# Patient Record
Sex: Female | Born: 1940 | Race: White | Hispanic: No | Marital: Single | Smoking: Former smoker
Health system: Southern US, Community
[De-identification: ages and names within clinical notes are randomized; demographics above are authoritative.]

## PROBLEM LIST (undated history)

## (undated) DIAGNOSIS — R251 Tremor, unspecified: Secondary | ICD-10-CM

## (undated) DIAGNOSIS — I480 Paroxysmal atrial fibrillation: Secondary | ICD-10-CM

## (undated) DIAGNOSIS — R413 Other amnesia: Secondary | ICD-10-CM

## (undated) DIAGNOSIS — M3313 Other dermatomyositis without myopathy: Secondary | ICD-10-CM

## (undated) DIAGNOSIS — I10 Essential (primary) hypertension: Secondary | ICD-10-CM

## (undated) DIAGNOSIS — G629 Polyneuropathy, unspecified: Secondary | ICD-10-CM

## (undated) DIAGNOSIS — F068 Other specified mental disorders due to known physiological condition: Secondary | ICD-10-CM

## (undated) DIAGNOSIS — I639 Cerebral infarction, unspecified: Secondary | ICD-10-CM

## (undated) DIAGNOSIS — E119 Type 2 diabetes mellitus without complications: Secondary | ICD-10-CM

## (undated) DIAGNOSIS — I739 Peripheral vascular disease, unspecified: Secondary | ICD-10-CM

## (undated) DIAGNOSIS — I509 Heart failure, unspecified: Secondary | ICD-10-CM

## (undated) DIAGNOSIS — E785 Hyperlipidemia, unspecified: Secondary | ICD-10-CM

## (undated) DIAGNOSIS — M3390 Dermatopolymyositis, unspecified, organ involvement unspecified: Secondary | ICD-10-CM

## (undated) HISTORY — DX: Polyneuropathy, unspecified: G62.9

## (undated) HISTORY — PX: BREAST LUMPECTOMY: SHX2

## (undated) HISTORY — PX: ADENOIDECTOMY: SUR15

## (undated) HISTORY — PX: OTHER SURGICAL HISTORY: SHX169

## (undated) HISTORY — DX: Other amnesia: R41.3

## (undated) HISTORY — PX: INTRAOPERATIVE ARTERIOGRAM: SHX5157

## (undated) HISTORY — DX: Dermatopolymyositis, unspecified, organ involvement unspecified: M33.90

## (undated) HISTORY — DX: Essential (primary) hypertension: I10

## (undated) HISTORY — DX: Tremor, unspecified: R25.1

## (undated) HISTORY — DX: Peripheral vascular disease, unspecified: I73.9

## (undated) HISTORY — DX: Paroxysmal atrial fibrillation: I48.0

## (undated) HISTORY — DX: Heart failure, unspecified: I50.9

## (undated) HISTORY — DX: Cerebral infarction, unspecified: I63.9

## (undated) HISTORY — DX: Type 2 diabetes mellitus without complications: E11.9

## (undated) HISTORY — PX: KNEE CARTILAGE SURGERY: SHX688

## (undated) HISTORY — DX: Other specified mental disorders due to known physiological condition: F06.8

## (undated) HISTORY — DX: Hyperlipidemia, unspecified: E78.5

## (undated) HISTORY — DX: Other dermatomyositis without myopathy: M33.13

---

## 2010-12-11 HISTORY — PX: FEMORAL BYPASS: SHX50

## 2011-01-04 ENCOUNTER — Encounter: Payer: Self-pay | Admitting: Internal Medicine

## 2011-01-09 ENCOUNTER — Ambulatory Visit (INDEPENDENT_AMBULATORY_CARE_PROVIDER_SITE_OTHER): Payer: Medicare Other | Admitting: Internal Medicine

## 2011-01-09 ENCOUNTER — Encounter: Payer: Self-pay | Admitting: Internal Medicine

## 2011-01-09 VITALS — BP 111/63 | HR 70 | Ht 63.0 in | Wt 215.0 lb

## 2011-01-09 DIAGNOSIS — I739 Peripheral vascular disease, unspecified: Secondary | ICD-10-CM

## 2011-01-09 DIAGNOSIS — E785 Hyperlipidemia, unspecified: Secondary | ICD-10-CM

## 2011-01-09 DIAGNOSIS — I4891 Unspecified atrial fibrillation: Secondary | ICD-10-CM

## 2011-01-09 NOTE — Assessment & Plan Note (Signed)
She has clearly documented postoperative afib documented by EKG 12/13/10.  Her afib occurred in the setting of a prolonged vascular procedure.  She is unaware of any other episodes of afib outside of her recent surgery.  Her CHADsVASC score is 4.  She is therefore at increased stroke risk with afib.  I therefore think that it is important for [place] to monitor for further episodes of afib.  If she is documented to have any further afib, she should be switched from ASA/plavix to coumadin.  Given her recent fem/pop procedure and severe PVD, I will continue ASA/ Plavix in the interim. We will place a 21 day event monitor to evaluated for further afib. I will also obtain an echo to evaluate her LA size and evaluate for evidence of structural/ valvular heart disease. Continue toprol XL.

## 2011-01-09 NOTE — Assessment & Plan Note (Signed)
Doing well s/p fem pop bypass Continue ASA, plavix, and statin therapy.

## 2011-01-09 NOTE — Assessment & Plan Note (Signed)
Stable No change required today  

## 2011-01-09 NOTE — Progress Notes (Signed)
Sherry Vargas is a pleasant 70 y.o.WF patient with a h/o DM and PVD who presents for cardiology consultation regarding recently diagnosed afib.  She reports recently being admitted to [facility] for femoral popliteal bypass 6/12.  She has severe PVD and is s/p multiple failed endovascular stents to the R femoral artery in the past.  While in the hospital, she was observed to have afib.  She reports that this initially occurred 24 hours posteratively.  She reports symptoms of chest tightness with tachypalpitations.  While in the hospital, she had 2 further episodes of afib for which she was asymptomatic.  She was evaluated by Cardiology and placed on metoprolol and ASA 325mg  daily.  She reports doing well since that time.  She is unaware of any further symptoms of afib.  Presently, she is doing well without complaint.  Today, she denies symptoms of palpitations, chest pain, shortness of breath, orthopnea, PND, lower extremity edema, dizziness, presyncope, syncope, or neurologic sequela. The patient is tolerating medications without difficulties and is otherwise without complaint today.   Past Medical History  Diagnosis Date  . HLD (hyperlipidemia)   . DM (dermatomyositis)   . PAD (peripheral artery disease)   . 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 Paroxysmal a-fib    Past Surgical History  Procedure Date  . Adenoidectomy   . Breast lumpectomy     benign  . Knee cartilage surgery   . Intraoperative arteriogram     for claudication in L leg 2004 at [facility]  . Endovascular stents     R leg at [facility]  . Femoral bypass 12/11/10    R leg at [facility]    Current Outpatient Prescriptions  Medication Sig Dispense Refill  . aspirin 325 MG tablet Take 325 mg by mouth daily.        [name] CINNAMON PO Take by mouth. With chromine, 2 tablets daily        . clopidogrel (PLAVIX) 75 MG tablet Take 75 mg by mouth daily.        . fish oil-omega-3 fatty acids 1000 MG capsule Take 1 g by mouth daily.        [name]  glipiZIDE (GLUCOTROL) 5 MG tablet Take 1 tablet by mouth Daily.      . LUTEIN-ZEAXANTHIN PO Take by mouth daily.        . metFORMIN (GLUCOPHAGE) 500 MG tablet Take 1,000 mg by mouth 2 (two) times daily with a meal.        . metoprolol (TOPROL-XL) 100 MG 24 hr tablet Take 1 tablet by mouth Daily.      . pioglitazone (ACTOS) 15 MG tablet Take 15 mg by mouth 2 (two) times daily.        . Rosuvastatin Calcium (CRESTOR PO) Take 15 mg by mouth.          No Known Allergies  History   Social History  . Marital Status: Single    Spouse Name: N/A    Number of Children: N/A  . Years of Education: N/A   Occupational History  . Not on file.   Social History Main Topics  . Smoking status: Former Games developer  . Smokeless tobacco: Not on file   Comment: quit 2005  . Alcohol Use: Yes     occasionally  . Drug Use: No  . Sexually Active: Not on file   Other Topics Concern  . Not on file   Social History Narrative   Lives in [place],  near [employer].Retired Product/process development scientist    Family History  Problem Relation Age of Onset  . Lung cancer Mother   . Diabetes Father     a fib also    ROS- All systems are reviewed and negative except as per the HPI above  Physical Exam: Filed Vitals:   01/09/11 1602  BP: 111/63  Pulse: 70  Height: 5\' 3"  (1.6 m)  Weight: 215 lb (97.523 kg)    GEN- The patient is well appearing, alert and oriented x 3 today.   Head- normocephalic, atraumatic Eyes-  Sclera clear, conjunctiva pink Ears- hearing intact Oropharynx- clear Neck- supple, no JVP Lymph- no cervical lymphadenopathy Lungs- Clear to ausculation bilaterally, normal work of breathing Heart- Regular rate and rhythm, early systolic murmur LUSB, no rubs or gallops, PMI not laterally displaced GI- soft, NT, ND, + BS Extremities- no clubbing, cyanosis, 1+ RLE edema MS- no significant deformity or atrophy Skin- fem pop incisions are healing Psych- euthymic mood, full affect Neuro-  strength and sensation are intact  EKG today reveals sinus rhythm at 70 bpm, otherwise normal ekg EKG from 12/13/10 at [facility] reveals afib with RVR  Assessment and Plan:

## 2011-01-09 NOTE — Patient Instructions (Signed)
Your physician recommends that you schedule a follow-up appointment in:2 months with Dr Johney Frame  Your physician has requested that you have an echocardiogram. Echocardiography is a painless test that uses sound waves to create images of your heart. It provides your doctor with information about the size and shape of your heart and how well your heart's chambers and valves are working. This procedure takes approximately one hour. There are no restrictions for this procedure.  Your physician has recommended that you wear an event monitor. Event monitors are medical devices that record the heart's electrical activity. Doctors most often [place] these monitors to diagnose arrhythmias. Arrhythmias are problems with the speed or rhythm of the heartbeat. The monitor is a small, portable device. You can wear one while you do your normal daily activities. This is usually used to diagnose what is causing palpitations/syncope (passing out).

## 2011-01-18 ENCOUNTER — Encounter (INDEPENDENT_AMBULATORY_CARE_PROVIDER_SITE_OTHER): Payer: Medicare Other

## 2011-01-18 ENCOUNTER — Ambulatory Visit (HOSPITAL_COMMUNITY): Payer: Medicare Other | Attending: Internal Medicine | Admitting: Radiology

## 2011-01-18 DIAGNOSIS — I4891 Unspecified atrial fibrillation: Secondary | ICD-10-CM

## 2011-01-18 DIAGNOSIS — R002 Palpitations: Secondary | ICD-10-CM | POA: Insufficient documentation

## 2011-01-18 DIAGNOSIS — E119 Type 2 diabetes mellitus without complications: Secondary | ICD-10-CM | POA: Insufficient documentation

## 2011-01-18 DIAGNOSIS — R000 Tachycardia, unspecified: Secondary | ICD-10-CM | POA: Insufficient documentation

## 2011-01-18 DIAGNOSIS — E785 Hyperlipidemia, unspecified: Secondary | ICD-10-CM | POA: Insufficient documentation

## 2011-01-18 DIAGNOSIS — I059 Rheumatic mitral valve disease, unspecified: Secondary | ICD-10-CM | POA: Insufficient documentation

## 2011-01-18 DIAGNOSIS — I079 Rheumatic tricuspid valve disease, unspecified: Secondary | ICD-10-CM | POA: Insufficient documentation

## 2011-01-28 ENCOUNTER — Encounter: Payer: Self-pay | Admitting: Internal Medicine

## 2011-02-19 ENCOUNTER — Encounter: Payer: Self-pay | Admitting: *Deleted

## 2011-03-13 ENCOUNTER — Ambulatory Visit: Payer: Medicare Other | Admitting: Internal Medicine

## 2011-03-18 ENCOUNTER — Ambulatory Visit (INDEPENDENT_AMBULATORY_CARE_PROVIDER_SITE_OTHER): Payer: Medicare Other | Admitting: Internal Medicine

## 2011-03-18 ENCOUNTER — Encounter: Payer: Self-pay | Admitting: Internal Medicine

## 2011-03-18 DIAGNOSIS — E785 Hyperlipidemia, unspecified: Secondary | ICD-10-CM

## 2011-03-18 DIAGNOSIS — I4891 Unspecified atrial fibrillation: Secondary | ICD-10-CM

## 2011-03-18 DIAGNOSIS — I10 Essential (primary) hypertension: Secondary | ICD-10-CM

## 2011-03-18 DIAGNOSIS — I739 Peripheral vascular disease, unspecified: Secondary | ICD-10-CM

## 2011-03-18 NOTE — Progress Notes (Signed)
The patient presents today for routine cardiology followup.  Since last being seen in our clinic, the patient reports doing very well. She is unaware of any further afib. Today, she denies symptoms of palpitations, chest pain, shortness of breath, orthopnea, PND, lower extremity edema, dizziness, presyncope, syncope, or neurologic sequela.  The patient feels that she is tolerating medications without difficulties and is otherwise without complaint today.   Past Medical History  Diagnosis Date  . HLD (hyperlipidemia)   . DM (dermatomyositis)   . PAD (peripheral artery disease)   . 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 Paroxysmal a-fib    Past Surgical History  Procedure Date  . Adenoidectomy   . Breast lumpectomy     benign  . Knee cartilage surgery   . Intraoperative arteriogram     for claudication in L leg 2004 at [facility]  . Endovascular stents     R leg at [facility]  . Femoral bypass 12/11/10    R leg at [facility]    Current Outpatient Prescriptions  Medication Sig Dispense Refill  . aspirin 325 MG tablet Take 325 mg by mouth daily.        [name] CINNAMON PO Take by mouth. With chromine, 2 tablets daily        . clopidogrel (PLAVIX) 75 MG tablet Take 75 mg by mouth daily.        . fish oil-omega-3 fatty acids 1000 MG capsule Take 1 g by mouth daily.        [name] glipiZIDE (GLUCOTROL) 5 MG tablet Take 1 tablet by mouth Daily.      . LUTEIN-ZEAXANTHIN PO Take by mouth daily.        . metFORMIN (GLUCOPHAGE) 500 MG tablet Take 1,000 mg by mouth 2 (two) times daily with a meal.        . metoprolol (TOPROL-XL) 100 MG 24 hr tablet Take 1 tablet by mouth Daily.      . pioglitazone (ACTOS) 15 MG tablet Take 15 mg by mouth 2 (two) times daily.        . Rosuvastatin Calcium (CRESTOR PO) Take 15 mg by mouth.          No Known Allergies  History   Social History  . Marital Status: Single    Spouse Name: N/A    Number of Children: N/A  . Years of Education: N/A   Occupational History    . Not on file.   Social History Main Topics  . Smoking status: Former Games developer  . Smokeless tobacco: Not on file   Comment: quit 2005  . Alcohol Use: Yes     occasionally  . Drug Use: No  . Sexually Active: Not on file   Other Topics Concern  . Not on file   Social History Narrative   Lives in [place], near [place].Retired Product/process development scientist    Family History  Problem Relation Age of Onset  . Lung cancer Mother   . Diabetes Father     a fib also    ROS-  All systems are reviewed and are negative except as outlined in the HPI above   Physical Exam: Filed Vitals:   03/18/11 1043  BP: 118/64  Pulse: 70  Height: 5' 3.5" (1.613 m)  Weight: 215 lb 12.8 oz (97.886 kg)    GEN- The patient is well appearing, alert and oriented x 3 today.   Head- normocephalic, atraumatic Eyes-  Sclera clear, conjunctiva pink Ears-  hearing intact Oropharynx- clear Neck- supple, no JVP Lymph- no cervical lymphadenopathy Lungs- Clear to ausculation bilaterally, normal work of breathing Heart- Regular rate and rhythm, no murmurs, rubs or gallops, PMI not laterally displaced GI- soft, NT, ND, + BS Extremities- no clubbing, cyanosis, or edema MS- no significant deformity or atrophy Skin- no rash or lesion Psych- euthymic mood, full affect Neuro- strength and sensation are intact  ekg today reveals sinus rhythm 70 bpm,otherwise normal ekg Event monitor from 7/12-8/12 reveals no afib/ no arrhythmias Echo reviewed with patient  Assessment and Plan:

## 2011-03-18 NOTE — Patient Instructions (Signed)
Your physician wants you to follow-up in: 4 months You will receive a reminder letter in the mail two months in advance. If you don't receive a letter, please call our office to schedule the follow-up appointment.     .Your physician recommends that you continue on your current medications as directed. Please refer to the Current Medication list given to you today.  

## 2011-03-18 NOTE — Assessment & Plan Note (Signed)
Stable No change required today  

## 2011-03-18 NOTE — Assessment & Plan Note (Signed)
She had prior intraop afib but has done very well since. Recent event monitor reveals no afib We will monitor clinically and continue ASA/Plavix for PVD.  If she has any further afib, she will require coumadin or a novel anticoagulant.  Continue toprol for rate control.

## 2011-05-13 ENCOUNTER — Encounter (HOSPITAL_BASED_OUTPATIENT_CLINIC_OR_DEPARTMENT_OTHER): Payer: Medicare Other | Attending: General Surgery

## 2011-05-13 DIAGNOSIS — Y832 Surgical operation with anastomosis, bypass or graft as the cause of abnormal reaction of the patient, or of later complication, without mention of misadventure at the time of the procedure: Secondary | ICD-10-CM | POA: Insufficient documentation

## 2011-05-13 DIAGNOSIS — Z7982 Long term (current) use of aspirin: Secondary | ICD-10-CM | POA: Insufficient documentation

## 2011-05-13 DIAGNOSIS — I10 Essential (primary) hypertension: Secondary | ICD-10-CM | POA: Insufficient documentation

## 2011-05-13 DIAGNOSIS — I872 Venous insufficiency (chronic) (peripheral): Secondary | ICD-10-CM | POA: Insufficient documentation

## 2011-05-13 DIAGNOSIS — E669 Obesity, unspecified: Secondary | ICD-10-CM | POA: Insufficient documentation

## 2011-05-13 DIAGNOSIS — T8189XA Other complications of procedures, not elsewhere classified, initial encounter: Secondary | ICD-10-CM | POA: Insufficient documentation

## 2011-05-13 DIAGNOSIS — L97809 Non-pressure chronic ulcer of other part of unspecified lower leg with unspecified severity: Secondary | ICD-10-CM | POA: Insufficient documentation

## 2011-05-13 DIAGNOSIS — E119 Type 2 diabetes mellitus without complications: Secondary | ICD-10-CM | POA: Insufficient documentation

## 2011-05-13 DIAGNOSIS — Z7902 Long term (current) use of antithrombotics/antiplatelets: Secondary | ICD-10-CM | POA: Insufficient documentation

## 2011-05-13 DIAGNOSIS — Z79899 Other long term (current) drug therapy: Secondary | ICD-10-CM | POA: Insufficient documentation

## 2011-05-13 NOTE — H&P (Signed)
NAME:  Sherry Vargas, BORBA NO.:  [id number]  MEDICAL RECORD NO.:  [id number]  LOCATION:  [facility]                         FACILITY:  [facility]  PHYSICIAN:  Ardath Sax, M.D.     DATE OF BIRTH:  05/11/1941  DATE OF ADMISSION:  01/18/2011 DATE OF DISCHARGE:  01/18/2011                             HISTORY & PHYSICAL   This is a 70 year old lady who comes to the [facility] for the first time with a history of having undergone a right femoral-popliteal bypass graft, and now the lower wound on the medial side of her right thigh is open, it is about 3 inches long and 1/2 inch deep.  It is very clean and has been taking care of by the patient and her family doctor.  She is diabetic, hypertensive, and obese.  She is on medication for her hypertension and her diabetes.  She is on Plavix, Crestor, aspirin, Toprol-XL, glipizide, and metformin.  She is also on oxycodone for pain. [name] while she was here today we washed out the wound and we put collagen on it and we are making application for her insurance coming to okay an Apligraf on her next visit, so hopefully next week we will put on an Apligraf.     Ardath Sax, M.D.     PP/MEDQ  D:  05/13/2011  T:  05/13/2011  Job:  [id number]

## 2011-06-03 ENCOUNTER — Encounter (HOSPITAL_BASED_OUTPATIENT_CLINIC_OR_DEPARTMENT_OTHER): Payer: Medicare Other | Attending: Internal Medicine

## 2011-06-03 DIAGNOSIS — M79609 Pain in unspecified limb: Secondary | ICD-10-CM | POA: Insufficient documentation

## 2011-06-03 DIAGNOSIS — Y838 Other surgical procedures as the cause of abnormal reaction of the patient, or of later complication, without mention of misadventure at the time of the procedure: Secondary | ICD-10-CM | POA: Insufficient documentation

## 2011-06-03 DIAGNOSIS — E119 Type 2 diabetes mellitus without complications: Secondary | ICD-10-CM | POA: Insufficient documentation

## 2011-06-03 DIAGNOSIS — T8189XA Other complications of procedures, not elsewhere classified, initial encounter: Secondary | ICD-10-CM | POA: Insufficient documentation

## 2011-06-03 DIAGNOSIS — I739 Peripheral vascular disease, unspecified: Secondary | ICD-10-CM | POA: Insufficient documentation

## 2011-06-10 ENCOUNTER — Encounter (HOSPITAL_BASED_OUTPATIENT_CLINIC_OR_DEPARTMENT_OTHER): Payer: Medicare Other

## 2011-07-08 ENCOUNTER — Encounter (HOSPITAL_BASED_OUTPATIENT_CLINIC_OR_DEPARTMENT_OTHER): Payer: Medicare Other | Attending: Internal Medicine

## 2011-07-08 DIAGNOSIS — T8189XA Other complications of procedures, not elsewhere classified, initial encounter: Secondary | ICD-10-CM | POA: Insufficient documentation

## 2011-07-08 DIAGNOSIS — I739 Peripheral vascular disease, unspecified: Secondary | ICD-10-CM | POA: Insufficient documentation

## 2011-07-08 DIAGNOSIS — Y838 Other surgical procedures as the cause of abnormal reaction of the patient, or of later complication, without mention of misadventure at the time of the procedure: Secondary | ICD-10-CM | POA: Insufficient documentation

## 2011-07-08 DIAGNOSIS — M79609 Pain in unspecified limb: Secondary | ICD-10-CM | POA: Insufficient documentation

## 2011-07-08 DIAGNOSIS — E119 Type 2 diabetes mellitus without complications: Secondary | ICD-10-CM | POA: Insufficient documentation

## 2011-07-15 ENCOUNTER — Encounter: Payer: Self-pay | Admitting: Internal Medicine

## 2011-07-15 ENCOUNTER — Ambulatory Visit (INDEPENDENT_AMBULATORY_CARE_PROVIDER_SITE_OTHER): Payer: Medicare Other | Admitting: Internal Medicine

## 2011-07-15 VITALS — BP 120/66 | HR 72 | Ht 63.0 in | Wt 222.0 lb

## 2011-07-15 DIAGNOSIS — I4891 Unspecified atrial fibrillation: Secondary | ICD-10-CM

## 2011-07-15 NOTE — Assessment & Plan Note (Signed)
Post op afib following femoral bypass 6/12  Continue ASA 81mg  daily and plavix 75mg  for PVD.  Consider coumadin if afib recurs.

## 2011-07-15 NOTE — Progress Notes (Signed)
The patient presents today for routine cardiology followup.  Since last being seen in our clinic, she has been unaware of any further afib. Her primary concern is with poor wound healing from her femoral bypass.  Her venous stasis is stable.  Today, she denies symptoms of palpitations, chest pain, or  shortness of breath.  The patient feels that she is tolerating medications without difficulties and is otherwise without complaint today.   Past Medical History  Diagnosis Date  . HLD (hyperlipidemia)   . DM (dermatomyositis)   . PAD (peripheral artery disease)   . 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 Paroxysmal a-fib     post operative following fem bypass at [facility] 12/11/10   Past Surgical History  Procedure Date  . Adenoidectomy   . Breast lumpectomy     benign  . Knee cartilage surgery   . Intraoperative arteriogram     for claudication in L leg 2004 at [facility]  . Endovascular stents     R leg at [facility]  . Femoral bypass 12/11/10    R leg at [facility]    Current Outpatient Prescriptions  Medication Sig Dispense Refill  . aspirin 325 MG tablet Take 325 mg by mouth daily.        [name] CINNAMON PO Take by mouth. With chromine, 2 tablets daily        . clopidogrel (PLAVIX) 75 MG tablet Take 75 mg by mouth daily.        . fish oil-omega-3 fatty acids 1000 MG capsule Take 1 g by mouth daily.        . furosemide (LASIX) 40 MG tablet Take 1 tablet by mouth Every other day.      [name] glipiZIDE (GLUCOTROL) 5 MG tablet Take 1 tablet by mouth Daily.      [name] lisinopril (PRINIVIL,ZESTRIL) 5 MG tablet Take 5 mg by mouth daily.      . LUTEIN-ZEAXANTHIN PO Take by mouth daily.        . metFORMIN (GLUCOPHAGE) 500 MG tablet Take 500 mg by mouth 2 (two) times daily with a meal.       . metoprolol (TOPROL-XL) 100 MG 24 hr tablet Take 1 tablet by mouth Daily.      . pioglitazone-metformin (ACTOPLUS MET) 15-500 MG per tablet Take 1 tablet by mouth 2 (two) times daily with a meal.      . Rosuvastatin  Calcium (CRESTOR PO) Take 15 mg by mouth.          No Known Allergies  History   Social History  . Marital Status: Single    Spouse Name: N/A    Number of Children: N/A  . Years of Education: N/A   Occupational History  . Not on file.   Social History Main Topics  . Smoking status: Former Games developer  . Smokeless tobacco: Not on file   Comment: quit 2005  . Alcohol Use: Yes     occasionally  . Drug Use: No  . Sexually Active: Not on file   Other Topics Concern  . Not on file   Social History Narrative   Lives in [place], near [place].Retired Product/process development scientist    Family History  Problem Relation Age of Onset  . Lung cancer Mother   . Diabetes Father     a fib also   Physical Exam: Filed Vitals:   07/15/11 1527  BP: 120/66  Pulse: 72  Height: 5\' 3"  (1.6 m)  Weight: 222 lb (100.699 kg)    GEN- The patient is well appearing, alert and oriented x 3 today.   Head- normocephalic, atraumatic Eyes-  Sclera clear, conjunctiva pink Ears- hearing intact Oropharynx- clear Neck- supple, no JVP Lymph- no cervical lymphadenopathy Lungs- Clear to ausculation bilaterally, normal work of breathing Heart- Regular rate and rhythm, no murmurs, rubs or gallops, PMI not laterally displaced GI- soft, NT, ND, + BS Extremities- no clubbing, cyanosis, 2+ ble edema with venous stasis changes  ekg today reveals sinus rhythm 72 bpm,otherwise normal ekg Event monitor from 7/12-8/12 reveals no afib/ no arrhythmias   Assessment and Plan:

## 2011-07-15 NOTE — Patient Instructions (Signed)
Your physician wants you to follow-up in: 9 months with Dr Lona Millard will receive a reminder letter in the mail two months in advance. If you don't receive a letter, please call our office to schedule the follow-up appointment.   Decrease ASA to 81mg  daily

## 2012-05-05 ENCOUNTER — Encounter: Payer: Self-pay | Admitting: Internal Medicine

## 2012-05-22 ENCOUNTER — Ambulatory Visit (INDEPENDENT_AMBULATORY_CARE_PROVIDER_SITE_OTHER): Payer: Medicare Other | Admitting: Internal Medicine

## 2012-05-22 ENCOUNTER — Encounter: Payer: Self-pay | Admitting: Internal Medicine

## 2012-05-22 VITALS — BP 135/43 | HR 75 | Ht 63.0 in | Wt 227.0 lb

## 2012-05-22 DIAGNOSIS — E785 Hyperlipidemia, unspecified: Secondary | ICD-10-CM

## 2012-05-22 DIAGNOSIS — I4891 Unspecified atrial fibrillation: Secondary | ICD-10-CM

## 2012-05-22 DIAGNOSIS — I739 Peripheral vascular disease, unspecified: Secondary | ICD-10-CM

## 2012-05-22 NOTE — Patient Instructions (Addendum)
Your physician wants you to follow-up in: 12 months with Dr Allred You will receive a reminder letter in the mail two months in advance. If you don't receive a letter, please call our office to schedule the follow-up appointment.  

## 2012-05-24 NOTE — Assessment & Plan Note (Signed)
Stable No change required today  

## 2012-05-24 NOTE — Assessment & Plan Note (Signed)
Only 1 episode of perioperative afib, none since If she develops afib in the future then she will require anticoagulation at that point  No changes at this time

## 2012-05-24 NOTE — Assessment & Plan Note (Signed)
Followed at [facility] Continue ASA and plavix

## 2012-05-24 NOTE — Progress Notes (Signed)
PCP: Leo Grosser, MD  Sherry Vargas is a 71 y.o. female who presents today for routine electrophysiology followup.  Since last being seen in our clinic, the patient reports doing very well. She has had no further afib.  Today, she denies symptoms of palpitations, chest pain, shortness of breath,  lower extremity edema, dizziness, presyncope, or syncope.  The patient is otherwise without complaint today.   Past Medical History  Diagnosis Date  . HLD (hyperlipidemia)   . DM (dermatomyositis)   . PAD (peripheral artery disease)   . 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 Paroxysmal a-fib     post operative following fem bypass at [facility] 12/11/10   Past Surgical History  Procedure Date  . Adenoidectomy   . Breast lumpectomy     benign  . Knee cartilage surgery   . Intraoperative arteriogram     for claudication in L leg 2004 at [facility]  . Endovascular stents     R leg at [facility]  . Femoral bypass 12/11/10    R leg at [facility]    Current Outpatient Prescriptions  Medication Sig Dispense Refill  . aspirin 81 MG tablet Take one daily      . clopidogrel (PLAVIX) 75 MG tablet Take 75 mg by mouth daily.        [name] glipiZIDE (GLUCOTROL) 5 MG tablet Take 1 tablet by mouth Daily.      . IRON PO Take by mouth daily.      [name] lisinopril (PRINIVIL,ZESTRIL) 5 MG tablet Take 5 mg by mouth daily.      . LUTEIN-ZEAXANTHIN PO Take by mouth daily.        . metFORMIN (GLUCOPHAGE) 500 MG tablet Take 500 mg by mouth 2 (two) times daily with a meal.       . metoprolol (TOPROL-XL) 100 MG 24 hr tablet Take 1 tablet by mouth Daily.      [name] PANTOPRAZOLE SODIUM PO Take by mouth daily.      . pioglitazone-metformin (ACTOPLUS MET) 15-500 MG per tablet Take 1 tablet by mouth 2 (two) times daily with a meal.      . Rosuvastatin Calcium (CRESTOR PO) Take 15 mg by mouth.          Physical Exam: Filed Vitals:   05/22/12 1130  BP: 135/43  Pulse: 75  Height: 5\' 3"  (1.6 m)  Weight: 227 lb (102.967 kg)     GEN- The patient is well appearing, alert and oriented x 3 today.   Head- normocephalic, atraumatic Eyes-  Sclera clear, conjunctiva pink Ears- hearing intact Oropharynx- clear Lungs- Clear to ausculation bilaterally, normal work of breathing Heart- Regular rate and rhythm, no murmurs, rubs or gallops, PMI not laterally displaced GI- soft, NT, ND, + BS Extremities- no clubbing, cyanosis, or edema  ekg today reveals sinus rhythm 67 bpm, otherwise normal ekg  Assessment and Plan:

## 2013-03-19 ENCOUNTER — Ambulatory Visit (INDEPENDENT_AMBULATORY_CARE_PROVIDER_SITE_OTHER): Payer: Medicare Other | Admitting: Family Medicine

## 2013-03-19 ENCOUNTER — Encounter: Payer: Self-pay | Admitting: Family Medicine

## 2013-03-19 VITALS — BP 108/64 | HR 78 | Temp 98.3°F | Resp 18 | Ht 62.0 in | Wt 233.0 lb

## 2013-03-19 DIAGNOSIS — E119 Type 2 diabetes mellitus without complications: Secondary | ICD-10-CM | POA: Insufficient documentation

## 2013-03-19 DIAGNOSIS — L538 Other specified erythematous conditions: Secondary | ICD-10-CM

## 2013-03-19 DIAGNOSIS — R0609 Other forms of dyspnea: Secondary | ICD-10-CM

## 2013-03-19 DIAGNOSIS — I4891 Unspecified atrial fibrillation: Secondary | ICD-10-CM

## 2013-03-19 DIAGNOSIS — Z23 Encounter for immunization: Secondary | ICD-10-CM

## 2013-03-19 DIAGNOSIS — L304 Erythema intertrigo: Secondary | ICD-10-CM

## 2013-03-19 LAB — COMPLETE METABOLIC PANEL WITH GFR
Albumin: 3.7 g/dL (ref 3.5–5.2)
CO2: 27 mEq/L (ref 19–32)
Calcium: 9.1 mg/dL (ref 8.4–10.5)
Chloride: 97 mEq/L (ref 96–112)
GFR, Est African American: 89 mL/min
GFR, Est Non African American: 77 mL/min
Glucose, Bld: 130 mg/dL — ABNORMAL HIGH (ref 70–99)
Potassium: 3.8 mEq/L (ref 3.5–5.3)
Sodium: 129 mEq/L — ABNORMAL LOW (ref 135–145)
Total Protein: 6.1 g/dL (ref 6.0–8.3)

## 2013-03-19 LAB — CBC WITH DIFFERENTIAL/PLATELET
HCT: 32.8 % — ABNORMAL LOW (ref 36.0–46.0)
Hemoglobin: 10.7 g/dL — ABNORMAL LOW (ref 12.0–15.0)
Lymphs Abs: 1.6 10*3/uL (ref 0.7–4.0)
Monocytes Relative: 6 % (ref 3–12)
Neutro Abs: 4.1 10*3/uL (ref 1.7–7.7)
Neutrophils Relative %: 64 % (ref 43–77)
RBC: 3.84 MIL/uL — ABNORMAL LOW (ref 3.87–5.11)

## 2013-03-19 LAB — LIPID PANEL
LDL Cholesterol: 51 mg/dL (ref 0–99)
Triglycerides: 93 mg/dL (ref <150)
VLDL: 19 mg/dL (ref 0–40)

## 2013-03-19 MED ORDER — CLOTRIMAZOLE-BETAMETHASONE 1-0.05 % EX CREA: TOPICAL_CREAM | Freq: Two times a day (BID) | CUTANEOUS | Status: DC

## 2013-03-19 MED ORDER — METOPROLOL SUCCINATE ER 100 MG PO TB24: 200.0000 mg | ORAL_TABLET | Freq: Every day | ORAL | Status: DC

## 2013-03-19 NOTE — Progress Notes (Signed)
Subjective:    Patient ID: Sherry Vargas, female    DOB: 29-Jan-1941, 72 y.o.   MRN: [id number]  HPI Patient presents today complaining of dyspnea on exertion. She has a history of paroxysmal nature fibrillation. Today in the exam she is definitely in atrial fibrillation. Her heart rate is 100-120 beats per minute and erratic. She denies any chest pain.  However she gets winded with minimal activity he ate she also has a painful itching rash in the intertriginous areas of both upper thighs and her perineum. This has the characteristic appearance of candida intertrigo. Her past medical history is complicated by diabetes mellitus type 2, hyperlipidemia, peripheral vascular disease. She had an echocardiogram of the heart performed in 2012 and ejection fraction of 60-65%. She did have some mild pulmonary hypertension.  Of note she has gained approximately 6 pounds since her last office visit. She has some mild pitting edema in both extremities. She ran out of her furosemide one month ago and has not been taking it. This may also be contributing to her peripheral edema. Past Medical History  Diagnosis Date  . HLD (hyperlipidemia)   . DM (dermatomyositis)   . PAD (peripheral artery disease)   . 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 Paroxysmal a-fib     post operative following fem bypass at [facility] 12/11/10  . Hypertension   . Diabetes mellitus without complication    Past Surgical History  Procedure Laterality Date  . Adenoidectomy    . Breast lumpectomy      benign  . Knee cartilage surgery    . Intraoperative arteriogram      for claudication in L leg 2004 at [facility]  . Endovascular stents      R leg at [facility]  . Femoral bypass  12/11/10    R leg at [facility]   Current Outpatient Prescriptions on File Prior to Visit  Medication Sig Dispense Refill  . aspirin 81 MG tablet Take one daily      . clopidogrel (PLAVIX) 75 MG tablet Take 75 mg by mouth daily.        [name] glipiZIDE (GLUCOTROL)  5 MG tablet Take 1 tablet by mouth Daily.      [name] lisinopril (PRINIVIL,ZESTRIL) 5 MG tablet Take 5 mg by mouth daily.      . LUTEIN-ZEAXANTHIN PO Take by mouth daily.        . metFORMIN (GLUCOPHAGE) 500 MG tablet Take 500 mg by mouth 2 (two) times daily with a meal.       . metoprolol (TOPROL-XL) 100 MG 24 hr tablet Take 1 tablet by mouth Daily.      . pioglitazone-metformin (ACTOPLUS MET) 15-500 MG per tablet Take 1 tablet by mouth 2 (two) times daily with a meal.       No current facility-administered medications on file prior to visit.   No Known Allergies History   Social History  . Marital Status: Single    Spouse Name: N/A    Number of Children: N/A  . Years of Education: N/A   Occupational History  . Not on file.   Social History Main Topics  . Smoking status: Former Games developer  . Smokeless tobacco: Not on file     Comment: quit 2005  . Alcohol Use: Yes     Comment: occasionally  . Drug Use: No  . Sexual Activity: Not on file   Other Topics Concern  . Not on file   Social History Narrative  Lives in [place], near [place].   Retired Product/process development scientist   Family History  Problem Relation Age of Onset  . Lung cancer Mother   . Diabetes Father     a fib also      Review of Systems  All other systems reviewed and are negative.       Objective:   Physical Exam  Vitals reviewed. Constitutional: She appears well-developed and well-nourished.  Neck: Neck supple. No JVD present. No thyromegaly present.  Cardiovascular: Normal heart sounds and intact distal pulses.  An irregularly irregular rhythm present. Tachycardia present.  PMI is not displaced.   Pulmonary/Chest: Effort normal and breath sounds normal. No respiratory distress. She has no wheezes. She has no rales. She exhibits no tenderness.  Abdominal: Soft. Bowel sounds are normal. She exhibits no distension and no mass. There is no tenderness. There is no rebound and no guarding.  Musculoskeletal:  She exhibits edema.  Lymphadenopathy:    She has no cervical adenopathy.  Skin: Skin is warm. Rash noted. There is erythema.   intertrigo in the upper thighs bilaterally. EKG reveals atrial fibrillation with tachycardia at 121 beats per minute. There is no evidence of ischemia or infarction. There no ST changes.        Assessment & Plan:  1. Need for prophylactic vaccination and inoculation against influenza  - Flu Vaccine QUAD 36+ mos IM  2. Dyspnea on exertion I believe this is likely related to tachycardia due to age fibrillation with poor rate control. I am going to arrange a follow up with Dr. Johney Frame. Meanwhile I'm going to increase her Toprol-XL to 200 mg a day. Asked patient to resume her Lasix. I will also check some fasting lab work to rule out anemia.  See the patient back next week for a recheck. - EKG 12-Lead - CBC with Differential - COMPLETE METABOLIC PANEL WITH GFR - Hemoglobin A1c - Lipid panel - Pro b natriuretic peptide (BNP)  3. Atrial fibrillation Increase Toprol to 200 mg by mouth daily. Currently taking aspirin and Plavix. She is not on any anti-thrombotic agent for stroke prevention 4. Intertrigo Lotrisone twice a day for 10-14 days. - clotrimazole-betamethasone (LOTRISONE) cream; Apply topically 2 (two) times daily.  Dispense: 30 g; Refill: 0

## 2013-03-22 ENCOUNTER — Ambulatory Visit (INDEPENDENT_AMBULATORY_CARE_PROVIDER_SITE_OTHER): Payer: Medicare Other | Admitting: Family Medicine

## 2013-03-22 ENCOUNTER — Encounter: Payer: Self-pay | Admitting: Family Medicine

## 2013-03-22 VITALS — BP 138/84 | HR 114 | Resp 20 | Wt 235.0 lb

## 2013-03-22 DIAGNOSIS — R0989 Other specified symptoms and signs involving the circulatory and respiratory systems: Secondary | ICD-10-CM

## 2013-03-22 DIAGNOSIS — I4891 Unspecified atrial fibrillation: Secondary | ICD-10-CM

## 2013-03-22 DIAGNOSIS — R0609 Other forms of dyspnea: Secondary | ICD-10-CM

## 2013-03-22 MED ORDER — FUROSEMIDE 40 MG PO TABS: 40.0000 mg | ORAL_TABLET | Freq: Every day | ORAL | Status: DC | PRN

## 2013-03-22 MED ORDER — DILTIAZEM HCL ER COATED BEADS 120 MG PO CP24: 120.0000 mg | ORAL_CAPSULE | Freq: Every day | ORAL | Status: DC

## 2013-03-22 NOTE — Progress Notes (Signed)
Subjective:    Patient ID: Sherry Vargas, female    DOB: 1941-01-24, 72 y.o.   MRN: [id number]  HPI 03/19/13 Patient presents today complaining of dyspnea on exertion. She has a history of paroxysmal nature fibrillation. Today in the exam she is definitely in atrial fibrillation. Her heart rate is 100-120 beats per minute and erratic. She denies any chest pain.  However she gets winded with minimal activity he ate she also has a painful itching rash in the intertriginous areas of both upper thighs and her perineum. This has the characteristic appearance of candida intertrigo. Her past medical history is complicated by diabetes mellitus type 2, hyperlipidemia, peripheral vascular disease. She had an echocardiogram of the heart performed in 2012 and ejection fraction of 60-65%. She did have some mild pulmonary hypertension.  Of note she has gained approximately 6 pounds since her last office visit. She has some mild pitting edema in both extremities. She ran out of her furosemide one month ago and has not been taking it. This may also be contributing to her peripheral edema.  At that time, my plan was: 1. Dyspnea on exertion I believe this is likely related to tachycardia due to age fibrillation with poor rate control. I am going to arrange a follow up with Dr. Johney Frame. Meanwhile I'm going to increase her Toprol-XL to 200 mg a day. Asked patient to resume her Lasix. I will also check some fasting lab work to rule out anemia.  See the patient back next week for a recheck. - EKG 12-Lead - CBC with Differential - COMPLETE METABOLIC PANEL WITH GFR - Hemoglobin A1c - Lipid panel - Pro b natriuretic peptide (BNP)  2. Atrial fibrillation Increase Toprol to 200 mg by mouth daily. Currently taking aspirin and Plavix. She is not on any anti-thrombotic agent for stroke prevention  Her labs revealed: Office Visit on 03/19/2013  Component Date Value Range Status  . WBC 03/19/2013 6.4  4.0 - 10.5 K/uL Final  .  RBC 03/19/2013 3.84* 3.87 - 5.11 MIL/uL Final  . Hemoglobin 03/19/2013 10.7* 12.0 - 15.0 g/dL Final  . HCT 14/78/2956 32.8* 36.0 - 46.0 % Final  . MCV 03/19/2013 85.4  78.0 - 100.0 fL Final  . MCH 03/19/2013 27.9  26.0 - 34.0 pg Final  . MCHC 03/19/2013 32.6  30.0 - 36.0 g/dL Final  . RDW 21/30/8657 15.1  11.5 - 15.5 % Final  . Platelets 03/19/2013 274  150 - 400 K/uL Final  . Neutrophils Relative % 03/19/2013 64  43 - 77 % Final  . Neutro Abs 03/19/2013 4.1  1.7 - 7.7 K/uL Final  . Lymphocytes Relative 03/19/2013 26  12 - 46 % Final  . Lymphs Abs 03/19/2013 1.6  0.7 - 4.0 K/uL Final  . Monocytes Relative 03/19/2013 6  3 - 12 % Final  . Monocytes Absolute 03/19/2013 0.4  0.1 - 1.0 K/uL Final  . Eosinophils Relative 03/19/2013 3  0 - 5 % Final  . Eosinophils Absolute 03/19/2013 0.2  0.0 - 0.7 K/uL Final  . Basophils Relative 03/19/2013 1  0 - 1 % Final  . Basophils Absolute 03/19/2013 0.0  0.0 - 0.1 K/uL Final  . Smear Review 03/19/2013 Criteria for review not met   Final  . Sodium 03/19/2013 129* 135 - 145 mEq/L Final  . Potassium 03/19/2013 3.8  3.5 - 5.3 mEq/L Final  . Chloride 03/19/2013 97  96 - 112 mEq/L Final  . CO2 03/19/2013 27  19 - 32  mEq/L Final  . Glucose, Bld 03/19/2013 130* 70 - 99 mg/dL Final  . BUN 29/56/2130 12  6 - 23 mg/dL Final  . Creat 86/57/8469 0.78  0.50 - 1.10 mg/dL Final  . Total Bilirubin 03/19/2013 0.5  0.3 - 1.2 mg/dL Final  . Alkaline Phosphatase 03/19/2013 72  39 - 117 U/L Final  . AST 03/19/2013 12  0 - 37 U/L Final  . ALT 03/19/2013 10  0 - 35 U/L Final  . Total Protein 03/19/2013 6.1  6.0 - 8.3 g/dL Final  . Albumin 62/95/2841 3.7  3.5 - 5.2 g/dL Final  . Calcium 32/44/0102 9.1  8.4 - 10.5 mg/dL Final  . GFR, Est African American 03/19/2013 89   Final  . GFR, Est Non African American 03/19/2013 77   Final   Comment:                            The estimated GFR is a calculation valid for adults (>=25 years old)                          that uses  the CKD-EPI algorithm to adjust for age and sex. It is                            not to be used for children, pregnant women, hospitalized patients,                             patients on dialysis, or with rapidly changing kidney function.                          According to the NKDEP, eGFR >89 is normal, 60-89 shows mild                          impairment, 30-59 shows moderate impairment, 15-29 shows severe                          impairment and <15 is ESRD.                             [name] Hemoglobin A1C 03/19/2013 6.5* <5.7 % Final   Comment:                                                                                                 According to the ADA Clinical Practice Recommendations for 2011, when                          HbA1c is used as a screening test:                                                       >=  6.5%   Diagnostic of Diabetes Mellitus                                     (if abnormal result is confirmed)                                                     5.7-6.4%   Increased risk of developing Diabetes Mellitus                                                     References:Diagnosis and Classification of Diabetes Mellitus,Diabetes                          Care,2011,34(Suppl 1):S62-S69 and Standards of Medical Care in                                  Diabetes - 2011,Diabetes Care,2011,34 (Suppl 1):S11-S61.                             . Mean Plasma Glucose 03/19/2013 140* <117 mg/dL Final  . Cholesterol 16/04/9603 118  0 - 200 mg/dL Final   Comment: ATP III Classification:                                < 200        mg/dL        Desirable                               200 - 239     mg/dL        Borderline High                               >= 240        mg/dL        High                             . Triglycerides 03/19/2013 93  <150 mg/dL Final  . HDL 54/03/8118 48  >39 mg/dL Final  . Total CHOL/HDL Ratio 03/19/2013 2.5   Final  . VLDL 03/19/2013 19  0 - 40  mg/dL Final  . LDL Cholesterol 03/19/2013 51  0 - 99 mg/dL Final   Comment:                            Total Cholesterol/HDL Ratio:CHD Risk                                                 Coronary Heart  Disease Risk Table                                                                 Men       Women                                   1/2 Average Risk              3.4        3.3                                       Average Risk              5.0        4.4                                    2X Average Risk              9.6        7.1                                    3X Average Risk             23.4       11.0                          Use the calculated Patient Ratio above and the CHD Risk table                           to determine the patient's CHD Risk.                          ATP III Classification (LDL):                                < 100        mg/dL         Optimal                               100 - 129     mg/dL         Near or Above Optimal                               130 - 159     mg/dL         Borderline High                               160 - 189     mg/dL  High                                > 190        mg/dL         Very High                             . Pro B Natriuretic peptide (BNP) 03/19/2013 1513.00* <126 pg/mL Final   She is here today for follow up.  Her hemoglobin A1c and cholesterol were excellent. Unfortunately she is still mildly anemic. Her BNP was elevated at 1600. The patient has still not resumed her Lasix. She did increase her Toprol-XL to 200 mg by mouth daily.  She still reports dyspnea on exertion. She still reports tachycardia. She denies any chest pain or chest pressure.  Past Medical History  Diagnosis Date  . HLD (hyperlipidemia)   . DM (dermatomyositis)   . PAD (peripheral artery disease)   . 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 Paroxysmal a-fib     post operative following fem bypass at [facility] 12/11/10  .  Hypertension   . Diabetes mellitus without complication    Past Surgical History  Procedure Laterality Date  . Adenoidectomy    . Breast lumpectomy      benign  . Knee cartilage surgery    . Intraoperative arteriogram      for claudication in L leg 2004 at [facility]  . Endovascular stents      R leg at [facility]  . Femoral bypass  12/11/10    R leg at [facility]   Current Outpatient Prescriptions on File Prior to Visit  Medication Sig Dispense Refill  . aspirin 81 MG tablet Take one daily      . clopidogrel (PLAVIX) 75 MG tablet Take 75 mg by mouth daily.        . clotrimazole-betamethasone (LOTRISONE) cream Apply topically 2 (two) times daily.  30 g  0  . ferrous sulfate 325 (65 FE) MG EC tablet Take 325 mg by mouth daily with breakfast.      . furosemide (LASIX) 40 MG tablet Take 40 mg by mouth daily as needed.      [name] glipiZIDE (GLUCOTROL) 5 MG tablet Take 1 tablet by mouth Daily.      [name] lisinopril (PRINIVIL,ZESTRIL) 5 MG tablet Take 5 mg by mouth daily.      . LUTEIN-ZEAXANTHIN PO Take by mouth daily.        . metFORMIN (GLUCOPHAGE) 500 MG tablet Take 500 mg by mouth 2 (two) times daily with a meal.       . metoprolol succinate (TOPROL-XL) 100 MG 24 hr tablet Take 2 tablets (200 mg total) by mouth daily.  30 tablet  1  . pantoprazole (PROTONIX) 40 MG tablet Take 40 mg by mouth daily.      . pioglitazone-metformin (ACTOPLUS MET) 15-500 MG per tablet Take 1 tablet by mouth 2 (two) times daily with a meal.      . rosuvastatin (CRESTOR) 10 MG tablet Take 10 mg by mouth daily.       No current facility-administered medications on file prior to visit.   No Known Allergies History   Social History  . Marital Status: Single    Spouse Name: N/A    Number of Children: N/A  . Years of Education: N/A  Occupational History  . Not on file.   Social History Main Topics  . Smoking status: Former Games developer  . Smokeless tobacco: Not on file     Comment: quit 2005  . Alcohol Use: Yes     Comment:  occasionally  . Drug Use: No  . Sexual Activity: Not on file   Other Topics Concern  . Not on file   Social History Narrative   Lives in [place], near [place].   Retired Product/process development scientist   Family History  Problem Relation Age of Onset  . Lung cancer Mother   . Diabetes Father     a fib also      Review of Systems  All other systems reviewed and are negative.       Objective:   Physical Exam  Vitals reviewed. Constitutional: She appears well-developed and well-nourished.  Neck: Neck supple. No JVD present. No thyromegaly present.  Cardiovascular: Normal heart sounds and intact distal pulses.  An irregularly irregular rhythm present. Tachycardia present.  PMI is not displaced.   Pulmonary/Chest: Effort normal and breath sounds normal. No respiratory distress. She has no wheezes. She has no rales. She exhibits no tenderness.  Abdominal: Soft. Bowel sounds are normal. She exhibits no distension and no mass. There is no tenderness. There is no rebound and no guarding.  Musculoskeletal: She exhibits edema.  Lymphadenopathy:    She has no cervical adenopathy.  Skin: Skin is warm. Rash noted. There is erythema.          Assessment & Plan:  1. Atrial fibrillation I believe the patient's dyspnea on exertion is related to her age she fibrillation and uncontrolled tachycardia. I will add diltiazem CD 120 mg by mouth daily in the Toprol-XL. I will recheck her heart rate later this week. In the meantime I will refer her back to her cardiologist Dr. Johney Frame to discuss other options to control heart rate such as cardioversion or antiarrhythmic medication.  I also asked the patient to resume her Lasix 40 mg by mouth daily. I will schedule the patient for an echocardiogram to evaluate for systolic dysfunction. If she does have a diminished ejection fraction I would discontinue Actos and replace it with another hypoglycemic agent. - diltiazem (CARDIZEM CD) 120 MG 24 hr  capsule; Take 1 capsule (120 mg total) by mouth daily.  Dispense: 30 capsule; Refill: 1 - Ambulatory referral to Cardiology  2. Dyspnea on exertion - Ambulatory referral to Cardiology

## 2013-03-26 ENCOUNTER — Ambulatory Visit (INDEPENDENT_AMBULATORY_CARE_PROVIDER_SITE_OTHER): Payer: Medicare Other | Admitting: Family Medicine

## 2013-03-26 VITALS — BP 112/74 | HR 92

## 2013-03-26 DIAGNOSIS — I4891 Unspecified atrial fibrillation: Secondary | ICD-10-CM

## 2013-03-26 NOTE — Progress Notes (Signed)
Pt came for nurse visit pulse check.  In office 9/22 to see Dr Tanya Nones with [facility].  See vital signs for today readings.  Brought readings from home. 9/19 11am  108/64  P-121 9/22 10am  138/84  P-100         Noon   99/67    P-94                     95/61   P-92 (L)   116/73  P-95 (R)         10pm  105/56  P-74 (L)   100/61  P-93 (R) 9/23 10am  96/64    P-83 (L)   96/60    P-90 (R)         4pm    107/70  P-74 (L)   108/67  P-88 (R)         7pm    91/58    P-85 (L)   103/56  P-55 (R)         11pm  161/57  P-63 (L)   116/61  P-91 (R) 9/24 330pm 180/63 P-77         930pm 92/60   P-62 (L)    116/74 P-63 (R) 9/25 945am 111/68 P-91 (L)    139/61 P-58 (R)         740pm 118/71 P-85 (L)    103/65 P-68 (R)         930pm 103/58 P-78 (L)    95/58   P-62 (R) 9/26 11am  93/47    P-81 (L)    94/61   P-65 (R)  Has appt at [facility] Monday 9/29 with Dr Allred Echocardiogram sched for 04/07/13

## 2013-03-28 NOTE — Progress Notes (Signed)
  Subjective:    Patient ID: Sherry Vargas, female    DOB: 27-Dec-1940, 72 y.o.   MRN: [id number]  HPI  Heart rate is better on diltiazem   Review of Systems     Objective:   Physical Exam        Assessment & Plan:

## 2013-03-29 ENCOUNTER — Encounter: Payer: Self-pay | Admitting: Internal Medicine

## 2013-03-29 ENCOUNTER — Ambulatory Visit (INDEPENDENT_AMBULATORY_CARE_PROVIDER_SITE_OTHER): Payer: Medicare Other | Admitting: Internal Medicine

## 2013-03-29 VITALS — BP 92/60 | HR 99 | Ht 63.0 in | Wt 227.0 lb

## 2013-03-29 DIAGNOSIS — R5383 Other fatigue: Secondary | ICD-10-CM

## 2013-03-29 DIAGNOSIS — R0602 Shortness of breath: Secondary | ICD-10-CM

## 2013-03-29 DIAGNOSIS — I739 Peripheral vascular disease, unspecified: Secondary | ICD-10-CM

## 2013-03-29 DIAGNOSIS — R5381 Other malaise: Secondary | ICD-10-CM

## 2013-03-29 DIAGNOSIS — I4891 Unspecified atrial fibrillation: Secondary | ICD-10-CM

## 2013-03-29 MED ORDER — APIXABAN 5 MG PO TABS: 5.0000 mg | ORAL_TABLET | Freq: Two times a day (BID) | ORAL | Status: DC

## 2013-03-29 NOTE — Patient Instructions (Addendum)
Your physician recommends that you schedule a follow-up appointment in: 3 weeks with Tereso Newcomer, PA to evaluate Cardioversion and 2 months with Dr Johney Frame  Your physician has recommended you make the following change in your medication:  1) Stop Aspirin 2) Start Eliquis 5mg  twice daily   You have been referred to Dr Fabienne Bruns

## 2013-03-29 NOTE — Progress Notes (Signed)
PCP: Leo Grosser, MD  Sherry Vargas is a 72 y.o. female who presents today for routine electrophysiology followup.  Since last being seen in our clinic, the patient reports doing reasonably well.  She reports progressive fatigue and decreased exercise tolerance over the past month.  She recently presented to Dr Felisa Bonier office and was found to be back in afib with RVR.   Today, she denies symptoms of palpitations, chest pain, shortness of breath,  lower extremity edema, dizziness, presyncope, or syncope.  The patient is otherwise without complaint today.   Past Medical History  Diagnosis Date  . HLD (hyperlipidemia)   . DM (dermatomyositis)   . PAD (peripheral artery disease)   . 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 Paroxysmal a-fib     post operative following fem bypass at [facility] 12/11/10  . Hypertension   . Diabetes mellitus without complication    Past Surgical History  Procedure Laterality Date  . Adenoidectomy    . Breast lumpectomy      benign  . Knee cartilage surgery    . Intraoperative arteriogram      for claudication in L leg 2004 at [facility]  . Endovascular stents      R leg at [facility]  . Femoral bypass  12/11/10    R leg at [facility]    Current Outpatient Prescriptions  Medication Sig Dispense Refill  . clopidogrel (PLAVIX) 75 MG tablet Take 75 mg by mouth daily.        . clotrimazole-betamethasone (LOTRISONE) cream Apply topically 2 (two) times daily.  30 g  0  . diltiazem (CARDIZEM CD) 120 MG 24 hr capsule Take 1 capsule (120 mg total) by mouth daily.  30 capsule  1  . ferrous sulfate 325 (65 FE) MG EC tablet Take 325 mg by mouth daily with breakfast.      . furosemide (LASIX) 40 MG tablet Take 1 tablet (40 mg total) by mouth daily as needed.  30 tablet  3  . glipiZIDE (GLUCOTROL) 5 MG tablet Take 1 tablet by mouth Daily.      [name] lisinopril (PRINIVIL,ZESTRIL) 5 MG tablet Take 5 mg by mouth daily.      . LUTEIN-ZEAXANTHIN PO Take by mouth daily.        .  metFORMIN (GLUCOPHAGE) 500 MG tablet Take 500 mg by mouth 2 (two) times daily with a meal.       . metoprolol succinate (TOPROL-XL) 100 MG 24 hr tablet Take 2 tablets (200 mg total) by mouth daily.  30 tablet  1  . pantoprazole (PROTONIX) 40 MG tablet Take 40 mg by mouth daily.      . pioglitazone-metformin (ACTOPLUS MET) 15-500 MG per tablet Take 1 tablet by mouth 2 (two) times daily with a meal.      . rosuvastatin (CRESTOR) 10 MG tablet Take 10 mg by mouth daily.      [name] apixaban (ELIQUIS) 5 MG TABS tablet Take 1 tablet (5 mg total) by mouth 2 (two) times daily.  60 tablet  11  . apixaban (ELIQUIS) 5 MG TABS tablet Take 1 tablet (5 mg total) by mouth 2 (two) times daily.  60 tablet  0   No current facility-administered medications for this visit.    Physical Exam: Filed Vitals:   03/29/13 1225  BP: 92/60  Pulse: 99  Height: 5\' 3"  (1.6 m)  Weight: 227 lb (102.967 kg)    GEN- The patient is well appearing, alert and oriented  x 3 today.   Head- normocephalic, atraumatic Eyes-  Sclera clear, conjunctiva pink Ears- hearing intact Oropharynx- clear Lungs- Clear to ausculation bilaterally, normal work of breathing Heart- irregular rate and rhythm, no murmurs, rubs or gallops, PMI not laterally displaced GI- soft, NT, ND, + BS Extremities- no clubbing, cyanosis, + edema Neuro- nonfocal  ekg today reveals afib, V rate 99 bpm, nonspecific ST/T changes  Assessment and Plan:  1. Persistent afib The patient presents with symptomatic afib. V rates are improving slowly with medical therapy. Her CHADS2VASc score is at least 4.  I will therefore start eliquis 5mg  BID today.  Stop ASA and continue plavix She will return for repeat ekg in 3 weeks.  If she remains in afib at that time, then she should proceed with cardioversion (will see one of our PAs to arrange).  Would hold toprol/ diltiazem on the evening/ morning of cardioversion. Consider flecainide for maintenance of sinus rhythm  2.  PVD Stop ASA and continue plavix She would like referral to a local vascular surgeon for long term management  (her former surgeon is no longer at [facility])  3. Fatigue/ SOB Should improve with sinus rhythm Echo pending Would anticipate myoview once in sinus to evaluate for ischemia  Follow-up with Tereso Newcomer in 3 weeks  I will see in 2 months

## 2013-03-30 ENCOUNTER — Telehealth: Payer: Self-pay | Admitting: *Deleted

## 2013-03-30 NOTE — Telephone Encounter (Signed)
eliquis 5 mg bid approved through [employer] ref # [id number] for 1 year

## 2013-04-05 ENCOUNTER — Other Ambulatory Visit: Payer: Self-pay | Admitting: *Deleted

## 2013-04-05 DIAGNOSIS — I739 Peripheral vascular disease, unspecified: Secondary | ICD-10-CM

## 2013-04-07 ENCOUNTER — Other Ambulatory Visit (HOSPITAL_COMMUNITY): Payer: Self-pay | Admitting: Family Medicine

## 2013-04-07 ENCOUNTER — Ambulatory Visit (HOSPITAL_COMMUNITY): Payer: Medicare Other | Attending: Cardiology

## 2013-04-07 DIAGNOSIS — I2789 Other specified pulmonary heart diseases: Secondary | ICD-10-CM | POA: Insufficient documentation

## 2013-04-07 DIAGNOSIS — R0989 Other specified symptoms and signs involving the circulatory and respiratory systems: Secondary | ICD-10-CM | POA: Insufficient documentation

## 2013-04-07 DIAGNOSIS — E119 Type 2 diabetes mellitus without complications: Secondary | ICD-10-CM | POA: Insufficient documentation

## 2013-04-07 DIAGNOSIS — R609 Edema, unspecified: Secondary | ICD-10-CM | POA: Insufficient documentation

## 2013-04-07 DIAGNOSIS — I079 Rheumatic tricuspid valve disease, unspecified: Secondary | ICD-10-CM | POA: Insufficient documentation

## 2013-04-07 DIAGNOSIS — R0609 Other forms of dyspnea: Secondary | ICD-10-CM | POA: Insufficient documentation

## 2013-04-07 DIAGNOSIS — E785 Hyperlipidemia, unspecified: Secondary | ICD-10-CM | POA: Insufficient documentation

## 2013-04-07 DIAGNOSIS — I4891 Unspecified atrial fibrillation: Secondary | ICD-10-CM | POA: Insufficient documentation

## 2013-04-07 DIAGNOSIS — I739 Peripheral vascular disease, unspecified: Secondary | ICD-10-CM | POA: Insufficient documentation

## 2013-04-07 DIAGNOSIS — I059 Rheumatic mitral valve disease, unspecified: Secondary | ICD-10-CM | POA: Insufficient documentation

## 2013-04-07 NOTE — Progress Notes (Signed)
Echocardiogram performed.  

## 2013-04-09 ENCOUNTER — Encounter: Payer: Self-pay | Admitting: Family Medicine

## 2013-04-09 ENCOUNTER — Ambulatory Visit (INDEPENDENT_AMBULATORY_CARE_PROVIDER_SITE_OTHER): Payer: Medicare Other | Admitting: Family Medicine

## 2013-04-09 VITALS — BP 128/64 | HR 92 | Temp 97.3°F | Resp 18 | Ht 63.0 in | Wt 225.0 lb

## 2013-04-09 DIAGNOSIS — I4891 Unspecified atrial fibrillation: Secondary | ICD-10-CM

## 2013-04-09 DIAGNOSIS — R0609 Other forms of dyspnea: Secondary | ICD-10-CM

## 2013-04-09 MED ORDER — GLIPIZIDE 5 MG PO TABS: 2.5000 mg | ORAL_TABLET | Freq: Two times a day (BID) | ORAL | Status: DC

## 2013-04-09 MED ORDER — ROSUVASTATIN CALCIUM 10 MG PO TABS: 10.0000 mg | ORAL_TABLET | Freq: Every day | ORAL | Status: AC

## 2013-04-09 MED ORDER — FUROSEMIDE 40 MG PO TABS: 40.0000 mg | ORAL_TABLET | Freq: Every day | ORAL | Status: DC | PRN

## 2013-04-09 MED ORDER — CLOPIDOGREL BISULFATE 75 MG PO TABS: 75.0000 mg | ORAL_TABLET | Freq: Every day | ORAL | Status: DC

## 2013-04-09 MED ORDER — LISINOPRIL 5 MG PO TABS: 5.0000 mg | ORAL_TABLET | Freq: Every day | ORAL | Status: DC

## 2013-04-09 MED ORDER — PANTOPRAZOLE SODIUM 40 MG PO TBEC: 40.0000 mg | DELAYED_RELEASE_TABLET | Freq: Every day | ORAL | Status: DC

## 2013-04-09 MED ORDER — DILTIAZEM HCL ER COATED BEADS 120 MG PO CP24: 120.0000 mg | ORAL_CAPSULE | Freq: Every day | ORAL | Status: DC

## 2013-04-09 MED ORDER — METFORMIN HCL 500 MG PO TABS: 1000.0000 mg | ORAL_TABLET | Freq: Two times a day (BID) | ORAL | Status: DC

## 2013-04-09 MED ORDER — METOPROLOL SUCCINATE ER 100 MG PO TB24: 200.0000 mg | ORAL_TABLET | Freq: Every day | ORAL | Status: DC

## 2013-04-09 NOTE — Progress Notes (Signed)
Subjective:    Patient ID: Sherry Vargas, female    DOB: December 06, 1940, 72 y.o.   MRN: [id number]  HPI 03/19/13 Patient presents today complaining of dyspnea on exertion. She has a history of paroxysmal nature fibrillation. Today in the exam she is definitely in atrial fibrillation. Her heart rate is 100-120 beats per minute and erratic. She denies any chest pain.  However she gets winded with minimal activity he ate she also has a painful itching rash in the intertriginous areas of both upper thighs and her perineum. This has the characteristic appearance of candida intertrigo. Her past medical history is complicated by diabetes mellitus type 2, hyperlipidemia, peripheral vascular disease. She had an echocardiogram of the heart performed in 2012 and ejection fraction of 60-65%. She did have some mild pulmonary hypertension.  Of note she has gained approximately 6 pounds since her last office visit. She has some mild pitting edema in both extremities. She ran out of her furosemide one month ago and has not been taking it. This may also be contributing to her peripheral edema.  At that time, my plan was: 1. Dyspnea on exertion I believe this is likely related to tachycardia due to age fibrillation with poor rate control. I am going to arrange a follow up with Dr. Johney Frame. Meanwhile I'm going to increase her Toprol-XL to 200 mg a day. Asked patient to resume her Lasix. I will also check some fasting lab work to rule out anemia.  See the patient back next week for a recheck. - EKG 12-Lead - CBC with Differential - COMPLETE METABOLIC PANEL WITH GFR - Hemoglobin A1c - Lipid panel - Pro b natriuretic peptide (BNP)  2. Atrial fibrillation Increase Toprol to 200 mg by mouth daily. Currently taking aspirin and Plavix. She is not on any anti-thrombotic agent for stroke prevention  Her labs revealed: No visits with results within 1 Week(s) from this visit. Latest known visit with results is:  Office Visit on  03/19/2013  Component Date Value Range Status  . WBC 03/19/2013 6.4  4.0 - 10.5 K/uL Final  . RBC 03/19/2013 3.84* 3.87 - 5.11 MIL/uL Final  . Hemoglobin 03/19/2013 10.7* 12.0 - 15.0 g/dL Final  . HCT 40/98/1191 32.8* 36.0 - 46.0 % Final  . MCV 03/19/2013 85.4  78.0 - 100.0 fL Final  . MCH 03/19/2013 27.9  26.0 - 34.0 pg Final  . MCHC 03/19/2013 32.6  30.0 - 36.0 g/dL Final  . RDW 47/82/9562 15.1  11.5 - 15.5 % Final  . Platelets 03/19/2013 274  150 - 400 K/uL Final  . Neutrophils Relative % 03/19/2013 64  43 - 77 % Final  . Neutro Abs 03/19/2013 4.1  1.7 - 7.7 K/uL Final  . Lymphocytes Relative 03/19/2013 26  12 - 46 % Final  . Lymphs Abs 03/19/2013 1.6  0.7 - 4.0 K/uL Final  . Monocytes Relative 03/19/2013 6  3 - 12 % Final  . Monocytes Absolute 03/19/2013 0.4  0.1 - 1.0 K/uL Final  . Eosinophils Relative 03/19/2013 3  0 - 5 % Final  . Eosinophils Absolute 03/19/2013 0.2  0.0 - 0.7 K/uL Final  . Basophils Relative 03/19/2013 1  0 - 1 % Final  . Basophils Absolute 03/19/2013 0.0  0.0 - 0.1 K/uL Final  . Smear Review 03/19/2013 Criteria for review not met   Final  . Sodium 03/19/2013 129* 135 - 145 mEq/L Final  . Potassium 03/19/2013 3.8  3.5 - 5.3 mEq/L Final  . Chloride  03/19/2013 97  96 - 112 mEq/L Final  . CO2 03/19/2013 27  19 - 32 mEq/L Final  . Glucose, Bld 03/19/2013 130* 70 - 99 mg/dL Final  . BUN 16/04/9603 12  6 - 23 mg/dL Final  . Creat 54/03/8118 0.78  0.50 - 1.10 mg/dL Final  . Total Bilirubin 03/19/2013 0.5  0.3 - 1.2 mg/dL Final  . Alkaline Phosphatase 03/19/2013 72  39 - 117 U/L Final  . AST 03/19/2013 12  0 - 37 U/L Final  . ALT 03/19/2013 10  0 - 35 U/L Final  . Total Protein 03/19/2013 6.1  6.0 - 8.3 g/dL Final  . Albumin 14/78/2956 3.7  3.5 - 5.2 g/dL Final  . Calcium 21/30/8657 9.1  8.4 - 10.5 mg/dL Final  . GFR, Est African American 03/19/2013 89   Final  . GFR, Est Non African American 03/19/2013 77   Final   Comment:                            The  estimated GFR is a calculation valid for adults (>=40 years old)                          that uses the CKD-EPI algorithm to adjust for age and sex. It is                            not to be used for children, pregnant women, hospitalized patients,                             patients on dialysis, or with rapidly changing kidney function.                          According to the NKDEP, eGFR >89 is normal, 60-89 shows mild                          impairment, 30-59 shows moderate impairment, 15-29 shows severe                          impairment and <15 is ESRD.                             [name] Hemoglobin A1C 03/19/2013 6.5* <5.7 % Final   Comment:                                                                                                 According to the ADA Clinical Practice Recommendations for 2011, when                          HbA1c is used as a screening test:                                                       >=  6.5%   Diagnostic of Diabetes Mellitus                                     (if abnormal result is confirmed)                                                     5.7-6.4%   Increased risk of developing Diabetes Mellitus                                                     References:Diagnosis and Classification of Diabetes Mellitus,Diabetes                          Care,2011,34(Suppl 1):S62-S69 and Standards of Medical Care in                                  Diabetes - 2011,Diabetes Care,2011,34 (Suppl 1):S11-S61.                             . Mean Plasma Glucose 03/19/2013 140* <117 mg/dL Final  . Cholesterol 16/04/9603 118  0 - 200 mg/dL Final   Comment: ATP III Classification:                                < 200        mg/dL        Desirable                               200 - 239     mg/dL        Borderline High                               >= 240        mg/dL        High                             . Triglycerides 03/19/2013 93  <150 mg/dL Final  . HDL  54/03/8118 48  >39 mg/dL Final  . Total CHOL/HDL Ratio 03/19/2013 2.5   Final  . VLDL 03/19/2013 19  0 - 40 mg/dL Final  . LDL Cholesterol 03/19/2013 51  0 - 99 mg/dL Final   Comment:                            Total Cholesterol/HDL Ratio:CHD Risk                                                 Coronary Heart  Disease Risk Table                                                                 Men       Women                                   1/2 Average Risk              3.4        3.3                                       Average Risk              5.0        4.4                                    2X Average Risk              9.6        7.1                                    3X Average Risk             23.4       11.0                          Use the calculated Patient Ratio above and the CHD Risk table                           to determine the patient's CHD Risk.                          ATP III Classification (LDL):                                < 100        mg/dL         Optimal                               100 - 129     mg/dL         Near or Above Optimal                               130 - 159     mg/dL         Borderline High                               160 - 189     mg/dL  High                                > 190        mg/dL         Very High                             . Pro B Natriuretic peptide (BNP) 03/19/2013 1513.00* <126 pg/mL Final   03/22/13 She is here today for follow up.  Her hemoglobin A1c and cholesterol were excellent. Unfortunately she is still mildly anemic. Her BNP was elevated at 1600. The patient has still not resumed her Lasix. She did increase her Toprol-XL to 200 mg by mouth daily.  She still reports dyspnea on exertion. She still reports tachycardia. She denies any chest pain or chest pressure.  At that time, my plan was: 1. Atrial fibrillation I believe the patient's dyspnea on exertion is related to her age she fibrillation and uncontrolled  tachycardia. I will add diltiazem CD 120 mg by mouth daily in the Toprol-XL. I will recheck her heart rate later this week. In the meantime I will refer her back to her cardiologist Dr. Johney Frame to discuss other options to control heart rate such as cardioversion or antiarrhythmic medication.  I also asked the patient to resume her Lasix 40 mg by mouth daily. I will schedule the patient for an echocardiogram to evaluate for systolic dysfunction. If she does have a diminished ejection fraction I would discontinue Actos and replace it with another hypoglycemic agent. - diltiazem (CARDIZEM CD) 120 MG 24 hr capsule; Take 1 capsule (120 mg total) by mouth daily.  Dispense: 30 capsule; Refill: 1 - Ambulatory referral to Cardiology  2. Dyspnea on exertion - Ambulatory referral to Cardiology  04/09/13 The patient has seen cardiology. They're scheduling her for cardioversion in one month. She has lost 12 pounds with diuresis. Her heart rate is still elevated but more appropriately controlled on diltiazem. She reports improving dyspnea on exertion with diuresis and heart rate control although it is not back to her baseline. The results of her most recent echocardiogram are listed below: Study Conclusions  - Left ventricle: The cavity size was normal. Wall thickness was increased in a pattern of mild LVH. The estimated ejection fraction was 60%. Regional wall motion abnormalities cannot be excluded. - Aortic valve: Sclerosis without stenosis. No significant regurgitation. - Mitral valve: Mild regurgitation. - Left atrium: The atrium was moderately dilated. - Pulmonary arteries: PA peak pressure: 31mm Hg (S).  I reviewed these results with the patient. I also reviewed her most recent lab work which is an excellent hemoglobin A1c of 6.5. An excellent LDL cholesterol.   Past Medical History  Diagnosis Date  . HLD (hyperlipidemia)   . DM (dermatomyositis)   . PAD (peripheral artery disease)   . 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 Paroxysmal a-fib     post operative following fem bypass at [facility] 12/11/10  . Hypertension   . Diabetes mellitus without complication    Past Surgical History  Procedure Laterality Date  . Adenoidectomy    . Breast lumpectomy      benign  . Knee cartilage surgery    . Intraoperative arteriogram      for claudication in L leg 2004 at [facility]  . Endovascular stents      R  leg at [facility]  . Femoral bypass  12/11/10    R leg at [facility]   Current Outpatient Prescriptions on File Prior to Visit  Medication Sig Dispense Refill  . apixaban (ELIQUIS) 5 MG TABS tablet Take 1 tablet (5 mg total) by mouth 2 (two) times daily.  60 tablet  11  . ferrous sulfate 325 (65 FE) MG EC tablet Take 325 mg by mouth daily with breakfast.      . LUTEIN-ZEAXANTHIN PO Take by mouth daily.        . pioglitazone-metformin (ACTOPLUS MET) 15-500 MG per tablet Take 1 tablet by mouth 2 (two) times daily with a meal.       No current facility-administered medications on file prior to visit.   No Known Allergies History   Social History  . Marital Status: Single    Spouse Name: N/A    Number of Children: N/A  . Years of Education: N/A   Occupational History  . Not on file.   Social History Main Topics  . Smoking status: Former Games developer  . Smokeless tobacco: Not on file     Comment: quit 2005  . Alcohol Use: Yes     Comment: occasionally  . Drug Use: No  . Sexual Activity: Not on file   Other Topics Concern  . Not on file   Social History Narrative   Lives in [place], near [place].   Retired Product/process development scientist   Family History  Problem Relation Age of Onset  . Lung cancer Mother   . Diabetes Father     a fib also      Review of Systems  All other systems reviewed and are negative.       Objective:   Physical Exam  Vitals reviewed. Constitutional: She appears well-developed and well-nourished.  Neck: Neck supple. No JVD present. No  thyromegaly present.  Cardiovascular: Normal heart sounds and intact distal pulses.  An irregularly irregular rhythm present. PMI is not displaced.   Pulmonary/Chest: Effort normal and breath sounds normal. No respiratory distress. She has no wheezes. She has no rales. She exhibits no tenderness.  Abdominal: Soft. Bowel sounds are normal. She exhibits no distension and no mass. There is no tenderness. There is no rebound and no guarding.  Musculoskeletal: She exhibits edema.  Lymphadenopathy:    She has no cervical adenopathy.  Skin: Skin is warm. No rash noted. No erythema.          Assessment & Plan:   1. Atrial fibrillation  - diltiazem (CARDIZEM CD) 120 MG 24 hr capsule; Take 1 capsule (120 mg total) by mouth daily.  Dispense: 30 capsule; Refill: 1  2. Dyspnea on exertion  At this point I feel we have done all we can safely control her heart rate with medication. I fear bradycardia if I increase Cardizem further. Therefore, I will await the results of her cardioversion.  She is able to maintain sinus rhythm we may need to decrease her diltiazem.  I also discontinued the patient's Actos/metformin given her shortness of breath and edema. I replaced that with metformin 1000 mg by mouth twice a day total. Recheck hemoglobin A1c in 3 months. The patient is scheduled to see a podiatrist due to a small callus she has on the plantar aspect of the Right  fifth MTP joint in one month

## 2013-04-22 ENCOUNTER — Encounter: Payer: Self-pay | Admitting: *Deleted

## 2013-04-22 ENCOUNTER — Ambulatory Visit (INDEPENDENT_AMBULATORY_CARE_PROVIDER_SITE_OTHER): Payer: Medicare Other | Admitting: Physician Assistant

## 2013-04-22 ENCOUNTER — Encounter: Payer: Self-pay | Admitting: Physician Assistant

## 2013-04-22 VITALS — BP 108/62 | HR 107 | Ht 63.0 in | Wt 224.4 lb

## 2013-04-22 DIAGNOSIS — I739 Peripheral vascular disease, unspecified: Secondary | ICD-10-CM

## 2013-04-22 DIAGNOSIS — E785 Hyperlipidemia, unspecified: Secondary | ICD-10-CM

## 2013-04-22 DIAGNOSIS — I4891 Unspecified atrial fibrillation: Secondary | ICD-10-CM

## 2013-04-22 DIAGNOSIS — R0602 Shortness of breath: Secondary | ICD-10-CM

## 2013-04-22 DIAGNOSIS — E871 Hypo-osmolality and hyponatremia: Secondary | ICD-10-CM

## 2013-04-22 DIAGNOSIS — I10 Essential (primary) hypertension: Secondary | ICD-10-CM

## 2013-04-22 LAB — CBC WITH DIFFERENTIAL/PLATELET
Basophils Absolute: 0 10*3/uL (ref 0.0–0.1)
Basophils Relative: 0.5 % (ref 0.0–3.0)
Eosinophils Absolute: 0.2 10*3/uL (ref 0.0–0.7)
Hemoglobin: 11.6 g/dL — ABNORMAL LOW (ref 12.0–15.0)
MCHC: 33.1 g/dL (ref 30.0–36.0)
MCV: 86.6 fl (ref 78.0–100.0)
Monocytes Absolute: 0.5 10*3/uL (ref 0.1–1.0)
Monocytes Relative: 6.3 % (ref 3.0–12.0)
Neutro Abs: 4.9 10*3/uL (ref 1.4–7.7)
RBC: 4.05 Mil/uL (ref 3.87–5.11)
RDW: 14.1 % (ref 11.5–14.6)

## 2013-04-22 LAB — BASIC METABOLIC PANEL
Chloride: 100 mEq/L (ref 96–112)
Creatinine, Ser: 0.8 mg/dL (ref 0.4–1.2)
GFR: 75.17 mL/min (ref >60.00)
Potassium: 3.5 mEq/L (ref 3.5–5.1)
Sodium: 140 mEq/L (ref 135–145)

## 2013-04-22 NOTE — Patient Instructions (Addendum)
Your physician has recommended that you have a Cardioversion (DCCV). Electrical Cardioversion uses a jolt of electricity to your heart either through paddles or wired patches attached to your chest. This is a controlled, usually prescheduled, procedure. Defibrillation is done under light anesthesia in the hospital, and you usually go home the day of the procedure. This is done to get your heart back into a normal rhythm. You are not awake for the procedure. Please see the instruction sheet given to you today. I WILL CALL YOU LATER TODAY WITH THE DATE AND TIME OF PROCEDURE  LABS TODAY; BMET, CBC W/DIFF  IMPORTANT TO MAKE SURE NOT TO MISS ANY DOSES OF ELIQUIS  CALLED BACK PT TODAY AND ADVISED OF DCCV IS ON 04/27/13 @ 2 PM WITH DR. Eden Emms PT ADVISED MORNING OF CARDIOVERSION TO HOLD GLIPIZIDE, LASIX, TOPROL, CARDIZEM

## 2013-04-22 NOTE — H&P (Signed)
History and Physical  Date:  04/22/2013   ID:  Sherry Vargas, DOB 02-05-1941, MRN [id number]  PCP:  Leo Grosser, MD  Cardiologist:  Dr. Hillis Range     History of Present Illness: Sherry Vargas is a 72 y.o. female who returns for follow up on atrial fibrillation. She has a history of peripheral arterial disease status post prior revascularization procedures at [facility], paroxysmal atrial fibrillation, T2DM, HTN, HL.  She recently saw Dr. Johney Frame because of recurrent atrial fibrillation with RVR.  CHADS2-VASc=5 (HTN, T2DM, Age > 56, female, vascular dsz).  She was started on Eliquis 5 bid for anticoagulation.  Her ASA was stopped but Plavix continued.  She is brought back today for follow up with an eye towards DCCV if she remains in AFib.  Echo (10/C./14): Mild LVH, EF 60%, aortic sclerosis without stenosis, mild MR, moderate LAE, PASP 31.  Since last seen, she is overall stable.  She denies chest pain. She continues to have dyspnea on exertion. She describes NYHA class IIb symptoms.  She denies orthopnea, PND. LE edema is stable. She continues to note fatigue. She denies syncope.  Labs (9/14):   Na 129, K 3.8, creatinine 0.78, ALT 10, BNP 1513, HDL 48, LDL 51, Hgb 10.7, N8G 6.5  Wt Readings from Last 3 Encounters:  04/09/13 225 lb (102.059 kg)  03/29/13 227 lb (102.967 kg)  03/22/13 235 lb (106.595 kg)     Past Medical History  Diagnosis Date  . HLD (hyperlipidemia)   . DM (dermatomyositis)   . PAD (peripheral artery disease)   . 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 Paroxysmal a-fib     post operative following fem bypass at [facility] 12/11/10  . Hypertension   . Diabetes mellitus without complication     Current Outpatient Prescriptions  Medication Sig Dispense Refill  . apixaban (ELIQUIS) 5 MG TABS tablet Take 1 tablet (5 mg total) by mouth 2 (two) times daily.  60 tablet  11  . clopidogrel (PLAVIX) 75 MG tablet Take 1 tablet (75 mg total) by mouth daily.  90  tablet  3  . diltiazem (CARDIZEM CD) 120 MG 24 hr capsule Take 1 capsule (120 mg total) by mouth daily.  30 capsule  1  . ferrous sulfate 325 (65 FE) MG EC tablet Take 325 mg by mouth daily with breakfast.      . furosemide (LASIX) 40 MG tablet Take 1 tablet (40 mg total) by mouth daily as needed.  30 tablet  3  . glipiZIDE (GLUCOTROL) 5 MG tablet Take 0.5 tablets (2.5 mg total) by mouth 2 (two) times daily.  90 tablet  3  . lisinopril (PRINIVIL,ZESTRIL) 5 MG tablet Take 1 tablet (5 mg total) by mouth daily.  90 tablet  3  . LUTEIN-ZEAXANTHIN PO Take by mouth daily.        . metFORMIN (GLUCOPHAGE) 500 MG tablet Take 2 tablets (1,000 mg total) by mouth 2 (two) times daily with a meal.  360 tablet  3  . metoprolol succinate (TOPROL-XL) 100 MG 24 hr tablet Take 2 tablets (200 mg total) by mouth daily.  30 tablet  1  . pantoprazole (PROTONIX) 40 MG tablet Take 1 tablet (40 mg total) by mouth daily.  90 tablet  3  . pioglitazone-metformin (ACTOPLUS MET) 15-500 MG per tablet Take 1 tablet by mouth 2 (two) times daily with a meal.      . rosuvastatin (CRESTOR) 10 MG tablet Take 1 tablet (10 mg  total) by mouth daily.  90 tablet  3   No current facility-administered medications for this visit.    Allergies:   No Known Allergies  Social History:  The patient  reports that she has quit smoking. She does not have any smokeless tobacco history on file. She reports that she drinks alcohol. She reports that she does not use illicit drugs.   Family History:  The patient's family history includes Diabetes in her father; Lung cancer in her mother.   ROS:  Please see the history of present illness.   She denies melena, hematochezia, fevers, cough.   All other systems reviewed and negative.   PHYSICAL EXAM: VS:  BP 108/62  Pulse 107  Ht 5\' 3"  (1.6 m)  Wt 224 lb 6.4 oz (101.787 kg)  BMI 39.76 kg/m2 Well nourished, well developed, in no acute distress HEENT: normal Neck: no JVD Cardiac:  normal S1, S2;  irregularly irregular rhythm; no murmur Lungs:  clear to auscultation bilaterally, no wheezing, rhonchi or rales Abd: soft, nontender, no hepatomegaly Ext: trace bilateral LE edema Skin: warm and dry Neuro:  CNs 2-12 intact, no focal abnormalities noted  EKG:  Atrial fibrillation, HR 107     ASSESSMENT AND PLAN:  1. Atrial Fibrillation:  Heart rate with borderline control. Continue current dose of Toprol and diltiazem. As she remains in atrial fibrillation and has been on Eliquis, uninterrupted, for over 3 weeks, I will arrange cardioversion as planned by Dr. Johney Frame.  She will hold her Toprol and diltiazem the morning of her procedure.  Check a CBC today. 2. PAD:  Continue Plavix. She has stopped aspirin. She has a referral pending with vascular surgery. 3. Hypertension:  Controlled. 4. Hyperlipidemia:  Continue statin. 5. Hyponatremia:  Repeat basic metabolic panel. 6. Diabetes Mellitus:  Hold Glipizide AM of DCCV. 7. Disposition:  Proceed with cardioversion as noted. Follow up with Dr. Johney Frame as planned 05/20/13.   Signed, Tereso Newcomer, PA-C  04/22/2013 11:04 AM    Hillis Range MD

## 2013-04-22 NOTE — Progress Notes (Signed)
[address] [place], [place]  [address] Phone: [phone] Fax:  [phone]  Date:  04/22/2013   ID:  Sherry Vargas, DOB [birth year], MRN [id number]  PCP:  Leo Grosser, MD  Cardiologist:  Dr. Hillis Range     History of Present Illness: Sherry Vargas is a 72 y.o. female who returns for follow up on atrial fibrillation. She has a history of peripheral arterial disease status post prior revascularization procedures at [facility], paroxysmal atrial fibrillation, T2DM, HTN, HL.  She recently saw Dr. Johney Frame because of recurrent atrial fibrillation with RVR.  CHADS2-VASc=5 (HTN, T2DM, Age > 60, female, vascular dsz).  She was started on Eliquis 5 bid for anticoagulation.  Her ASA was stopped but Plavix continued.  She is brought back today for follow up with an eye towards DCCV if she remains in AFib.  Echo (10/C./14): Mild LVH, EF 60%, aortic sclerosis without stenosis, mild MR, moderate LAE, PASP 31.  Since last seen, she is overall stable.  She denies chest pain. She continues to have dyspnea on exertion. She describes NYHA class IIb symptoms.  She denies orthopnea, PND. LE edema is stable. She continues to note fatigue. She denies syncope.  Labs (9/14):   Na 129, K 3.8, creatinine 0.78, ALT 10, BNP 1513, HDL 48, LDL 51, Hgb 10.7, X3K 6.5  Wt Readings from Last 3 Encounters:  04/09/13 225 lb (102.059 kg)  03/29/13 227 lb (102.967 kg)  03/22/13 235 lb (106.595 kg)     Past Medical History  Diagnosis Date  . HLD (hyperlipidemia)   . DM (dermatomyositis)   . PAD (peripheral artery disease)   . 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 Paroxysmal a-fib     post operative following fem bypass at [facility] 12/11/10  . Hypertension   . Diabetes mellitus without complication     Current Outpatient Prescriptions  Medication Sig Dispense Refill  . apixaban (ELIQUIS) 5 MG TABS tablet Take 1 tablet (5 mg total) by mouth 2 (two) times daily.  60 tablet  11  . clopidogrel  (PLAVIX) 75 MG tablet Take 1 tablet (75 mg total) by mouth daily.  90 tablet  3  . diltiazem (CARDIZEM CD) 120 MG 24 hr capsule Take 1 capsule (120 mg total) by mouth daily.  30 capsule  1  . ferrous sulfate 325 (65 FE) MG EC tablet Take 325 mg by mouth daily with breakfast.      . furosemide (LASIX) 40 MG tablet Take 1 tablet (40 mg total) by mouth daily as needed.  30 tablet  3  . glipiZIDE (GLUCOTROL) 5 MG tablet Take 0.5 tablets (2.5 mg total) by mouth 2 (two) times daily.  90 tablet  3  . lisinopril (PRINIVIL,ZESTRIL) 5 MG tablet Take 1 tablet (5 mg total) by mouth daily.  90 tablet  3  . LUTEIN-ZEAXANTHIN PO Take by mouth daily.        . metFORMIN (GLUCOPHAGE) 500 MG tablet Take 2 tablets (1,000 mg total) by mouth 2 (two) times daily with a meal.  360 tablet  3  . metoprolol succinate (TOPROL-XL) 100 MG 24 hr tablet Take 2 tablets (200 mg total) by mouth daily.  30 tablet  1  . pantoprazole (PROTONIX) 40 MG tablet Take 1 tablet (40 mg total) by mouth daily.  90 tablet  3  . pioglitazone-metformin (ACTOPLUS MET) 15-500 MG per tablet Take 1 tablet by mouth 2 (two) times daily with a meal.      .  rosuvastatin (CRESTOR) 10 MG tablet Take 1 tablet (10 mg total) by mouth daily.  90 tablet  3   No current facility-administered medications for this visit.    Allergies:   No Known Allergies  Social History:  The patient  reports that she has quit smoking. She does not have any smokeless tobacco history on file. She reports that she drinks alcohol. She reports that she does not use illicit drugs.   Family History:  The patient's family history includes Diabetes in her father; Lung cancer in her mother.   ROS:  Please see the history of present illness.   She denies melena, hematochezia, fevers, cough.   All other systems reviewed and negative.   PHYSICAL EXAM: VS:  BP 108/62  Pulse 107  Ht 5\' 3"  (1.6 m)  Wt 224 lb 6.4 oz (101.787 kg)  BMI 39.76 kg/m2 Well nourished, well developed, in no  acute distress HEENT: normal Neck: no JVD Cardiac:  normal S1, S2; irregularly irregular rhythm; no murmur Lungs:  clear to auscultation bilaterally, no wheezing, rhonchi or rales Abd: soft, nontender, no hepatomegaly Ext: trace bilateral LE edema Skin: warm and dry Neuro:  CNs 2-12 intact, no focal abnormalities noted  EKG:  Atrial fibrillation, HR 107     ASSESSMENT AND PLAN:  1. Atrial Fibrillation:  Heart rate with borderline control. Continue current dose of Toprol and diltiazem. As she remains in atrial fibrillation and has been on Eliquis, uninterrupted, for over 3 weeks, I will arrange cardioversion as planned by Dr. Johney Frame.  She will hold her Toprol and diltiazem the morning of her procedure.  Check a CBC today. 2. PAD:  Continue Plavix. She has stopped aspirin. She has a referral pending with vascular surgery. 3. Hypertension:  Controlled. 4. Hyperlipidemia:  Continue statin. 5. Hyponatremia:  Repeat basic metabolic panel. 6. Diabetes Mellitus:  Hold Glipizide AM of DCCV. 7. Disposition:  Proceed with cardioversion as noted. Follow up with Dr. Johney Frame as planned 05/20/13.   Signed, Tereso Newcomer, PA-C  04/22/2013 11:04 AM

## 2013-04-23 ENCOUNTER — Telehealth: Payer: Self-pay | Admitting: *Deleted

## 2013-04-23 NOTE — Telephone Encounter (Signed)
Message copied by Burnell Blanks on Fri Apr 23, 2013 12:17 PM ------      Message from: [place], [place] T      Created: Thu Apr 22, 2013  5:14 PM       Potassium and kidney function ok      Hgb stable      Continue with current treatment plan.      Tereso Newcomer, PA-C        04/22/2013 5:14 PM ------

## 2013-04-23 NOTE — Telephone Encounter (Signed)
Advised patient of lab results  

## 2013-04-27 ENCOUNTER — Telehealth: Payer: Self-pay | Admitting: Internal Medicine

## 2013-04-27 ENCOUNTER — Ambulatory Visit (HOSPITAL_COMMUNITY)
Admission: RE | Admit: 2013-04-27 | Discharge: 2013-04-27 | Disposition: A | Discharge status: Home / Self Care | Payer: Medicare Other | Source: Ambulatory Visit | Attending: Internal Medicine | Admitting: Internal Medicine

## 2013-04-27 ENCOUNTER — Encounter (HOSPITAL_COMMUNITY)
Admission: RE | Disposition: A | Discharge status: Home / Self Care | Payer: Self-pay | Source: Ambulatory Visit | Attending: Internal Medicine

## 2013-04-27 ENCOUNTER — Ambulatory Visit (HOSPITAL_COMMUNITY): Payer: Medicare Other | Admitting: Anesthesiology

## 2013-04-27 ENCOUNTER — Encounter (HOSPITAL_COMMUNITY): Payer: Self-pay

## 2013-04-27 ENCOUNTER — Encounter (HOSPITAL_COMMUNITY): Payer: Medicare Other | Admitting: Anesthesiology

## 2013-04-27 DIAGNOSIS — I10 Essential (primary) hypertension: Secondary | ICD-10-CM | POA: Insufficient documentation

## 2013-04-27 DIAGNOSIS — I4891 Unspecified atrial fibrillation: Secondary | ICD-10-CM | POA: Insufficient documentation

## 2013-04-27 DIAGNOSIS — E119 Type 2 diabetes mellitus without complications: Secondary | ICD-10-CM | POA: Insufficient documentation

## 2013-04-27 DIAGNOSIS — I739 Peripheral vascular disease, unspecified: Secondary | ICD-10-CM | POA: Insufficient documentation

## 2013-04-27 DIAGNOSIS — R0602 Shortness of breath: Secondary | ICD-10-CM | POA: Insufficient documentation

## 2013-04-27 HISTORY — PX: CARDIOVERSION: SHX1299

## 2013-04-27 LAB — BASIC METABOLIC PANEL
BUN: 19 mg/dL (ref 6–23)
Calcium: 8.4 mg/dL (ref 8.4–10.5)
Creatinine, Ser: 0.81 mg/dL (ref 0.50–1.10)
GFR calc Af Amer: 83 mL/min — ABNORMAL LOW (ref >90)
GFR calc non Af Amer: 72 mL/min — ABNORMAL LOW (ref >90)
Glucose, Bld: 129 mg/dL — ABNORMAL HIGH (ref 70–99)
Potassium: 3.3 mEq/L — ABNORMAL LOW (ref 3.5–5.1)
Sodium: 141 mEq/L (ref 135–145)

## 2013-04-27 SURGERY — CARDIOVERSION
Anesthesia: General

## 2013-04-27 MED ORDER — SODIUM CHLORIDE 0.9 % IV SOLN
INTRAVENOUS | Status: DC
  Administered 2013-04-27: 13:00:00 via INTRAVENOUS

## 2013-04-27 MED ORDER — PROPOFOL 10 MG/ML IV BOLUS
INTRAVENOUS | Status: DC | PRN
  Administered 2013-04-27: 30 mg via INTRAVENOUS
  Administered 2013-04-27: 20 mg via INTRAVENOUS
  Administered 2013-04-27: 50 mg via INTRAVENOUS

## 2013-04-27 NOTE — Anesthesia Preprocedure Evaluation (Addendum)
Anesthesia Evaluation  Patient identified by MRN, date of birth, ID band Patient awake    Reviewed: Allergy & Precautions, H&P , NPO status , Patient's Chart, lab work & pertinent test results, reviewed documented beta blocker date and time   Airway Mallampati: I TM Distance: <3 FB Neck ROM: full  Mouth opening: Limited Mouth Opening  Dental   Pulmonary shortness of breath,          Cardiovascular hypertension, Pt. on home beta blockers + Peripheral Vascular Disease + dysrhythmias Atrial Fibrillation Rhythm:irregular     Neuro/Psych    GI/Hepatic   Endo/Other  diabetes, Type 2, Oral Hypoglycemic Agents  Renal/GU      Musculoskeletal   Abdominal   Peds  Hematology   Anesthesia Other Findings   Reproductive/Obstetrics                          Anesthesia Physical Anesthesia Plan  ASA: III  Anesthesia Plan: General   Post-op Pain Management:    Induction:   Airway Management Planned:   Additional Equipment:   Intra-op Plan:   Post-operative Plan:   Informed Consent: I have reviewed the patients History and Physical, chart, labs and discussed the procedure including the risks, benefits and alternatives for the proposed anesthesia with the patient or authorized representative who has indicated his/her understanding and acceptance.   Dental Advisory Given  Plan Discussed with: Anesthesiologist and CRNA  Anesthesia Plan Comments:         Anesthesia Quick Evaluation

## 2013-04-27 NOTE — Interval H&P Note (Signed)
History and Physical Interval Note:  04/27/2013 12:31 PM  Sherry Vargas  has presented today for surgery, with the diagnosis of A FIB   The various methods of treatment have been discussed with the patient and family. After consideration of risks, benefits and other options for treatment, the patient has consented to  Procedure(s): CARDIOVERSION (N/A) as a surgical intervention .  The patient's history has been reviewed, patient examined, no change in status, stable for surgery.  I have reviewed the patient's chart and labs.  Questions were answered to the patient's satisfaction.     Charlton Haws

## 2013-04-27 NOTE — Telephone Encounter (Signed)
New Problem     Pt had a Cardioversion to day.   Pt states it did not work .   Pt would like to see Dr Johney Frame before her 11/20 appt,   Pt thinks it necessary since she is still in Afib.   Please give pt a call.   Thank you!

## 2013-04-27 NOTE — Anesthesia Postprocedure Evaluation (Signed)
  Anesthesia Post-op Note  Patient: Sherry Vargas  Procedure(s) Performed: Procedure(s): CARDIOVERSION (N/A)  Patient Location: PACU  Anesthesia Type:General  Level of Consciousness: awake and alert   Airway and Oxygen Therapy: Patient Spontanous Breathing  Post-op Pain: none  Post-op Assessment: Post-op Vital signs reviewed, Patient's Cardiovascular Status Stable and Respiratory Function Stable  Post-op Vital Signs: Reviewed  Filed Vitals:   04/27/13 1400  BP: 110/68  Pulse: 103  Temp:   Resp: 23    Complications: No apparent anesthesia complications

## 2013-04-27 NOTE — Transfer of Care (Signed)
Immediate Anesthesia Transfer of Care Note  Patient: Sherry Vargas  Procedure(s) Performed: Procedure(s): CARDIOVERSION (N/A)  Patient Location: PACU  Anesthesia Type:General  Level of Consciousness: awake, alert , oriented and sedated  Airway & Oxygen Therapy: Patient Spontanous Breathing and Patient connected to nasal cannula oxygen  Post-op Assessment: Report given to PACU RN, Post -op Vital signs reviewed and stable and Patient moving all extremities  Post vital signs: Reviewed and stable  Complications: No apparent anesthesia complications

## 2013-04-27 NOTE — Preoperative (Signed)
Beta Blockers   Reason not to administer Beta Blockers:Not Applicable 

## 2013-04-27 NOTE — CV Procedure (Signed)
DCC:  100 mg of propofol  Baseline rhythm afib rate 105 DCC biphasic x 3 120/200/200 Failed to convert  Plan:  F/U Dr Johney Frame for consideration of antiarrhythmic Rx  Charlton Haws

## 2013-04-28 ENCOUNTER — Encounter (HOSPITAL_COMMUNITY): Payer: Self-pay | Admitting: Cardiovascular Disease

## 2013-04-28 NOTE — Telephone Encounter (Signed)
Will see pat 04/29/13  Sherry Vargas is going to call patient and add her on

## 2013-04-28 NOTE — Anesthesia Postprocedure Evaluation (Signed)
  Anesthesia Post-op Note  Patient: Sherry Vargas  Procedure(s) Performed: Procedure(s): CARDIOVERSION (N/A)  Patient Location: PACU and Endoscopy Unit  Anesthesia Type:General  Level of Consciousness: awake  Airway and Oxygen Therapy: Patient Spontanous Breathing  Post-op Pain: mild  Post-op Assessment: Post-op Vital signs reviewed  Post-op Vital Signs: stable  Complications: No apparent anesthesia complications

## 2013-04-29 ENCOUNTER — Encounter: Payer: Self-pay | Admitting: Internal Medicine

## 2013-04-29 ENCOUNTER — Encounter (INDEPENDENT_AMBULATORY_CARE_PROVIDER_SITE_OTHER): Payer: Self-pay

## 2013-04-29 ENCOUNTER — Ambulatory Visit (INDEPENDENT_AMBULATORY_CARE_PROVIDER_SITE_OTHER): Payer: Medicare Other | Admitting: Internal Medicine

## 2013-04-29 VITALS — BP 98/62 | HR 106 | Ht 63.0 in | Wt 224.1 lb

## 2013-04-29 DIAGNOSIS — R5381 Other malaise: Secondary | ICD-10-CM

## 2013-04-29 DIAGNOSIS — R5383 Other fatigue: Secondary | ICD-10-CM

## 2013-04-29 DIAGNOSIS — I739 Peripheral vascular disease, unspecified: Secondary | ICD-10-CM

## 2013-04-29 DIAGNOSIS — I4891 Unspecified atrial fibrillation: Secondary | ICD-10-CM

## 2013-04-29 MED ORDER — FLECAINIDE ACETATE 50 MG PO TABS: 50.0000 mg | ORAL_TABLET | Freq: Two times a day (BID) | ORAL | Status: DC

## 2013-04-29 NOTE — Patient Instructions (Addendum)
  Your physician recommends that you schedule a follow-up appointment in: 2 weeks EKG in nurse room if still out of rhythm increase Flecainide to 100mg  twice daily and come back in 2 more weeks for EKG if still out of rhythm schedule a Cardioversion  If in rhythm in 2 weeks at EKG check will need a Lexiscan Myoview scheduled   Your physician recommends that you schedule a follow-up appointment in: 6 weeks with Dr Johney Frame  Your physician has recommended you make the following change in your medication:  1) Start Flecainide 50mg  twice daily

## 2013-05-03 NOTE — Progress Notes (Signed)
PCP: PICKARD,WARREN TOM, MD  Sherry Vargas is a 72 y.o. female who presents today for routine electrophysiology followup.  She recently presented for cardioversion.  This was unsuccessful.  She reports ongoing symptoms of fatigue and decreased exercise tolerance.  Today, she denies symptoms of palpitations, chest pain, shortness of breath,  lower extremity edema, dizziness, presyncope, or syncope.  The patient is otherwise without complaint today.   Past Medical History  Diagnosis Date  . HLD (hyperlipidemia)   . DM (dermatomyositis)   . PAD (peripheral artery disease)   . 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 Paroxysmal a-fib     post operative following fem bypass at [facility] 12/11/10  . Hypertension   . Diabetes mellitus without complication    Past Surgical History  Procedure Laterality Date  . Adenoidectomy    . Breast lumpectomy      benign  . Knee cartilage surgery    . Intraoperative arteriogram      for claudication in L leg 2004 at [facility]  . Endovascular stents      R leg at [facility]  . Femoral bypass  12/11/10    R leg at [facility]  . Cardioversion N/A 04/27/2013    Procedure: CARDIOVERSION;  Surgeon: Peter C Nishan, MD;  Location: [facility] ENDOSCOPY;  Service: Cardiovascular;  Laterality: N/A;    Current Outpatient Prescriptions  Medication Sig Dispense Refill  . apixaban (ELIQUIS) 5 MG TABS tablet Take 1 tablet (5 mg total) by mouth 2 (two) times daily.  60 tablet  11  . clopidogrel (PLAVIX) 75 MG tablet Take 1 tablet (75 mg total) by mouth daily.  90 tablet  3  . diltiazem (CARDIZEM CD) 120 MG 24 hr capsule Take 1 capsule (120 mg total) by mouth daily.  30 capsule  1  . furosemide (LASIX) 40 MG tablet Take 40 mg by mouth daily.      . glipiZIDE (GLUCOTROL) 5 MG tablet Take 0.5 tablets (2.5 mg total) by mouth 2 (two) times daily.  90 tablet  3  . IRON PO Take 65 mg by mouth daily.      . lisinopril (PRINIVIL,ZESTRIL) 5 MG tablet Take 1 tablet (5 mg total) by mouth  daily.  90 tablet  3  . LUTEIN-ZEAXANTHIN PO Take by mouth daily.        . metFORMIN (GLUCOPHAGE) 500 MG tablet Take 2 tablets (1,000 mg total) by mouth 2 (two) times daily with a meal.  360 tablet  3  . metoprolol succinate (TOPROL-XL) 100 MG 24 hr tablet Take 2 tablets (200 mg total) by mouth daily.  30 tablet  1  . pantoprazole (PROTONIX) 40 MG tablet Take 1 tablet (40 mg total) by mouth daily.  90 tablet  3  . rosuvastatin (CRESTOR) 10 MG tablet Take 1 tablet (10 mg total) by mouth daily.  90 tablet  3  . flecainide (TAMBOCOR) 50 MG tablet Take 1 tablet (50 mg total) by mouth 2 (two) times daily.  180 tablet  3   No current facility-administered medications for this visit.    Physical Exam: Filed Vitals:   04/29/13 1608  BP: 98/62  Pulse: 106  Height: 5' 3" (1.6 m)  Weight: 224 lb 1.9 oz (101.66 kg)    GEN- The patient is well appearing, alert and oriented x 3 today.   Head- normocephalic, atraumatic Eyes-  Sclera clear, conjunctiva pink Ears- hearing intact Oropharynx- clear Lungs- Clear to ausculation bilaterally, normal work of breathing   Heart- irregular rate and rhythm, no murmurs, rubs or gallops, PMI not laterally displaced GI- soft, NT, ND, + BS Extremities- no clubbing, cyanosis, + edema Neuro- nonfocal  ekg today reveals afib, V rate 106 bpm, nonspecific ST/T changes  Assessment and Plan:  1. Persistent afib The patient presents with symptomatic afib. V rates are improving slowly with medical therapy. Her CHADS2VASc score is at least 4.   She has been appropriately anticoagulated with eliquis 5mg BID today.   As she does not have known CAD, I will start flecainide 50mg BID today.  She will return in 2 weeks for an ekg.  If she is not in sinus then we will increase flecainide to 100mg BID at that time.  If she is in sinus, then we will proceed with lexiscan myoview to evaluate for CAD.  2. PVD On plavix.  She is referred to Dr Fields to establish long term care.   I would like for Dr Fields to comment on the need for plavix long term.  As she will require anticoagulation fore afib, it would be nice to avoid antiplatelet therapy if possible.  If she requires antiplatelet therapy, then we will continue both plavix and eliquis.  3. Fatigue/ SOB Should improve with sinus rhythm Would anticipate myoview once in sinus to evaluate for ischemia as the cause  Return for an ekg in 2 weeks with plan as above I will see in 2 months     

## 2013-05-05 ENCOUNTER — Encounter: Payer: Self-pay | Admitting: Vascular Surgery

## 2013-05-06 ENCOUNTER — Ambulatory Visit (INDEPENDENT_AMBULATORY_CARE_PROVIDER_SITE_OTHER)
Admission: RE | Admit: 2013-05-06 | Discharge: 2013-05-06 | Disposition: A | Discharge status: Home / Self Care | Payer: Medicare Other | Source: Ambulatory Visit | Attending: Vascular Surgery | Admitting: Vascular Surgery

## 2013-05-06 ENCOUNTER — Ambulatory Visit (INDEPENDENT_AMBULATORY_CARE_PROVIDER_SITE_OTHER): Payer: Medicare Other | Admitting: Vascular Surgery

## 2013-05-06 ENCOUNTER — Encounter: Payer: Self-pay | Admitting: Vascular Surgery

## 2013-05-06 ENCOUNTER — Ambulatory Visit (HOSPITAL_COMMUNITY)
Admission: RE | Admit: 2013-05-06 | Discharge: 2013-05-06 | Disposition: A | Discharge status: Home / Self Care | Payer: Medicare Other | Source: Ambulatory Visit | Attending: Vascular Surgery | Admitting: Vascular Surgery

## 2013-05-06 VITALS — BP 119/74 | HR 84 | Resp 18 | Ht 63.0 in | Wt 221.0 lb

## 2013-05-06 DIAGNOSIS — I739 Peripheral vascular disease, unspecified: Secondary | ICD-10-CM

## 2013-05-06 NOTE — Progress Notes (Signed)
[facility] [place] HISTORY AND PHYSICAL   History of Present Illness:  Patient is a 72 y.o. year old female who presents for evaluation of bilateral lower extremity pain. The patient has a history of a right femoral to below-knee popliteal bypass with vein at [facility] in 2012. This was done for claudication at 200 feet. She thinks she has also had angioplasties in her left leg in the past. She is on Plavix. She complains of pain in both legs with walking. 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  Other medical problems include atrial fibrillation which is followed by Dr. Johney Frame. She currently has been limited in her exercise secondary to this. She is on medication for rate control as well as Eliquis for anticoagulation. She also has a history of diabetes and hyperlipidemia both of which are currently stable. She denies rest pain in the feet. She denies history of nonhealing wounds in the feet.  Past Medical History  Diagnosis Date  . HLD (hyperlipidemia)   . DM (dermatomyositis)   . PAD (peripheral artery disease)   . 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 Paroxysmal a-fib     post operative following fem bypass at [facility] 12/11/10  . Hypertension   . Diabetes mellitus without complication   . Stroke     Past Surgical History  Procedure Laterality Date  . Adenoidectomy    . Breast lumpectomy      benign  . Knee cartilage surgery    . Intraoperative arteriogram      for claudication in L leg [facility] at [facility]  . Endovascular stents      R leg at [facility]  . Femoral bypass  12/11/10    R leg at [facility]  . Cardioversion N/A 04/27/2013    Procedure: CARDIOVERSION;  Surgeon: Wendall Stade, MD;  Location: [facility] ENDOSCOPY;  Service: Cardiovascular;  Laterality: N/A;    Social History History  Substance Use Topics  . Smoking status: Former Games developer  . Smokeless tobacco: Not on file     Comment: quit 2005  . Alcohol Use: Yes      Comment: occasionally    Family History Family History  Problem Relation Age of Onset  . Lung cancer Mother   . Diabetes Father     a fib also    Allergies  No Known Allergies   Current Outpatient Prescriptions  Medication Sig Dispense Refill  . apixaban (ELIQUIS) 5 MG TABS tablet Take 1 tablet (5 mg total) by mouth 2 (two) times daily.  60 tablet  11  . clopidogrel (PLAVIX) 75 MG tablet Take 1 tablet (75 mg total) by mouth daily.  90 tablet  3  . diltiazem (CARDIZEM CD) 120 MG 24 hr capsule Take 1 capsule (120 mg total) by mouth daily.  30 capsule  1  . flecainide (TAMBOCOR) 50 MG tablet Take 1 tablet (50 mg total) by mouth 2 (two) times daily.  180 tablet  3  . furosemide (LASIX) 40 MG tablet Take 40 mg by mouth daily.      [name] glipiZIDE (GLUCOTROL) 5 MG tablet Take 0.5 tablets (2.5 mg total) by mouth 2 (two) times daily.  90 tablet  3  . IRON PO Take 325 mg by mouth daily.       [name] lisinopril (PRINIVIL,ZESTRIL) 5 MG tablet Take 1 tablet (5 mg total) by mouth daily.  90 tablet  3  . LUTEIN-ZEAXANTHIN  PO Take by mouth daily.        . metFORMIN (GLUCOPHAGE) 500 MG tablet Take 2 tablets (1,000 mg total) by mouth 2 (two) times daily with a meal.  360 tablet  3  . metoprolol succinate (TOPROL-XL) 100 MG 24 hr tablet Take 2 tablets (200 mg total) by mouth daily.  30 tablet  1  . pantoprazole (PROTONIX) 40 MG tablet Take 1 tablet (40 mg total) by mouth daily.  90 tablet  3  . rosuvastatin (CRESTOR) 10 MG tablet Take 1 tablet (10 mg total) by mouth daily.  90 tablet  3   No current facility-administered medications for this visit.    ROS:   General:  No weight loss, Fever, chills  HEENT: No recent headaches, no nasal bleeding, no visual changes, no sore throat  Neurologic: No dizziness, blackouts, seizures. No recent symptoms of stroke or mini- stroke. No recent episodes of slurred speech, or temporary blindness.  Cardiac: No recent episodes of chest pain/pressure, no shortness  of breath at rest.  + shortness of breath with exertion.  + history of atrial fibrillation or irregular heartbeat  Vascular: No history of rest pain in feet.  No history of claudication.  No history of non-healing ulcer, No history of DVT   Pulmonary: No home oxygen, no productive cough, no hemoptysis,  No asthma or wheezing  Musculoskeletal:  [x ] Arthritis, [ ]  Low back pain,  [ ]  Joint pain  Hematologic:No history of hypercoagulable state.  No history of easy bleeding.  No history of anemia  Gastrointestinal: No hematochezia or melena,  No gastroesophageal reflux, no trouble swallowing  Urinary: [ ]  chronic Kidney disease, [ ]  on HD - [ ]  MWF or [ ]  TTHS, [ ]  Burning with urination, [ ]  Frequent urination, [ ]  Difficulty urinating;   Skin: No rashes  Psychological: No history of anxiety,  No history of depression   Physical Examination  Filed Vitals:   05/06/13 1252 05/06/13 1254  BP:  119/74  Pulse:  84  Resp: 18 18  Height: 5\' 3"  (1.6 m)   Weight: 221 lb (100.245 kg)     Body mass index is 39.16 kg/(m^2).  General:  Alert and oriented, no acute distress HEENT: Normal Neck: No bruit or JVD Pulmonary: Clear to auscultation bilaterally Cardiac: Regular Rate and Rhythm without murmur Abdomen: Soft, non-tender, non-distended, no mass, obese Skin: No rash Extremity Pulses:  2+ radial, brachial, femoral, vaguely palpable dorsalis pedis, posterior tibial pulses bilaterally Musculoskeletal: No deformity or edema  Neurologic: Upper and lower extremity motor 5/5 and symmetric  DATA:  Patient had bilateral ABIs performed today which I reviewed and interpreted. ABI on the right was 1.08 left 1.08 toe pressure 93 right 109 left biphasic waveforms bilaterally. Duplex ultrasound was also performed which shows a patent right femoral to below-knee popliteal bypass with some evidence of tibial occlusive disease.   ASSESSMENT:  Bilateral peripheral arterial disease. Currently with  normal ABIs and fairly normal waveforms bilaterally. Patent bypass right leg. Patient counseled and a walking program of 30 minutes daily when she is cleared to do this by Dr. Johney Frame. She will also try to lose some weight.   PLAN:  She will have a followup duplex ultrasound and ABIs in one year.  Fabienne Bruns, MD [facility] [place] Office: [phone] Pager: [phone]

## 2013-05-14 ENCOUNTER — Encounter (INDEPENDENT_AMBULATORY_CARE_PROVIDER_SITE_OTHER): Payer: Self-pay

## 2013-05-14 ENCOUNTER — Encounter: Payer: Self-pay | Admitting: Interventional Cardiology

## 2013-05-14 ENCOUNTER — Ambulatory Visit (INDEPENDENT_AMBULATORY_CARE_PROVIDER_SITE_OTHER): Payer: Medicare Other | Admitting: *Deleted

## 2013-05-14 VITALS — BP 100/60 | HR 95 | Wt 225.8 lb

## 2013-05-14 DIAGNOSIS — I4891 Unspecified atrial fibrillation: Secondary | ICD-10-CM

## 2013-05-14 MED ORDER — FLECAINIDE ACETATE 100 MG PO TABS: 100.0000 mg | ORAL_TABLET | Freq: Two times a day (BID) | ORAL | Status: DC

## 2013-05-14 NOTE — Progress Notes (Signed)
In for EKG due to starting Flecainide 50 mg BID.  Pt alert and oriented. Skin warm to touch.  No SOB.  She states she walked from parking lot today whereas she normally gets dropped off without SOB.  Reviewed medications.  Has not been taking BP or HR.  States she feels good.  EKG performed.  Read by Dr. Dietrich Pates (DOD) who advises that she increase Flecainide to 100 mg BID and have repeat EKG next Thursday when Dr. Johney Frame is here. No nurse visits available so per Doylene Bode have CMA-Deborah Jones perform EKG and then she will give to a nurse.  Pt to come on 11/20 at 11:00. Gavin Pound is aware to do EKG.  Pt states she has enough Flecainide to take 2 tablets BID.  Will check when she gets home and will call to have refilled.  Verbalizes understanding.

## 2013-05-17 ENCOUNTER — Encounter: Payer: Self-pay | Admitting: Physician Assistant

## 2013-05-17 ENCOUNTER — Ambulatory Visit (INDEPENDENT_AMBULATORY_CARE_PROVIDER_SITE_OTHER): Payer: Medicare Other | Admitting: Physician Assistant

## 2013-05-17 VITALS — BP 100/60 | HR 82 | Temp 97.0°F | Resp 18 | Ht 63.0 in | Wt 225.0 lb

## 2013-05-17 DIAGNOSIS — I4891 Unspecified atrial fibrillation: Secondary | ICD-10-CM

## 2013-05-17 DIAGNOSIS — R112 Nausea with vomiting, unspecified: Secondary | ICD-10-CM

## 2013-05-17 LAB — CBC W/MCH & 3 PART DIFF
HCT: 36.8 % (ref 36.0–46.0)
Hemoglobin: 12.1 g/dL (ref 12.0–15.0)
Lymphocytes Relative: 25 % (ref 12–46)
Lymphs Abs: 2.3 10*3/uL (ref 0.7–4.0)
MCH: 29.2 pg (ref 26.0–34.0)
MCHC: 32.9 g/dL (ref 30.0–36.0)
MCV: 88.9 fL (ref 78.0–100.0)
Neutro Abs: 6.3 10*3/uL (ref 1.7–7.7)
Neutrophils Relative %: 69 % (ref 43–77)
Platelets: 292 10*3/uL (ref 150–400)
RBC: 4.14 MIL/uL (ref 3.87–5.11)
RDW: 14.4 % (ref 11.5–15.5)
WBC mixed population %: 6 % (ref 3–18)
WBC mixed population: 0.5 10*3/uL (ref 0.1–1.8)
WBC: 9.1 10*3/uL (ref 4.0–10.5)

## 2013-05-17 NOTE — Progress Notes (Signed)
Patient ID: Sherry Vargas MRN: [id number], DOB: 19-Jan-1941, 72 y.o. Date of Encounter: 05/17/2013, 4:14 PM    Chief Complaint:  Chief Complaint  Patient presents with  . Nausea  . Vomiting     HPI: 72 y.o. year old white female here to evaluate some recent episodes of nausea and vomiting.  Says that Friday, which was about 10 days ago, cardiology started flecainide. On that Sunday and Monday she had 2 episodes of vomiting on each of those days. Says she only had very brief nausea prior to vomiting each of those times. Then, on the following days she felt fine with no nausea or vomiting. On that Thursday, cardiology double to dose of flecainide. She continued to feel okay on that Thursday and Friday. On that Saturday and Sunday she had one episode of vomiting on each of those days. Again she had very minimal brief nausea prior to each episode of vomiting. She has had no vomiting today. No nausea today.  Over this entire time she has had no abdominal pain. As well she has had no change in bowel habits. She says that in general her "normal" is that she will have no bowel movement for 2 or 3 days then she will  "clear out." She says that this has been the same as usual.     Home Meds: See attached medication section for any medications that were entered at today's visit. The computer does not put those onto this list.The following list is a list of meds entered prior to today's visit.   Current Outpatient Prescriptions on File Prior to Visit  Medication Sig Dispense Refill  . apixaban (ELIQUIS) 5 MG TABS tablet Take 1 tablet (5 mg total) by mouth 2 (two) times daily.  60 tablet  11  . clopidogrel (PLAVIX) 75 MG tablet Take 1 tablet (75 mg total) by mouth daily.  90 tablet  3  . diltiazem (CARDIZEM CD) 120 MG 24 hr capsule Take 1 capsule (120 mg total) by mouth daily.  30 capsule  1  . flecainide (TAMBOCOR) 100 MG tablet Take 1 tablet (100 mg total) by mouth 2 (two) times daily.   180 tablet  3  . furosemide (LASIX) 40 MG tablet Take 40 mg by mouth daily.      [name] glipiZIDE (GLUCOTROL) 5 MG tablet Take 0.5 tablets (2.5 mg total) by mouth 2 (two) times daily.  90 tablet  3  . IRON PO Take 325 mg by mouth daily.       [name] lisinopril (PRINIVIL,ZESTRIL) 5 MG tablet Take 1 tablet (5 mg total) by mouth daily.  90 tablet  3  . LUTEIN-ZEAXANTHIN PO Take by mouth daily.        . metFORMIN (GLUCOPHAGE) 500 MG tablet Take 2 tablets (1,000 mg total) by mouth 2 (two) times daily with a meal.  360 tablet  3  . metoprolol succinate (TOPROL-XL) 100 MG 24 hr tablet Take 2 tablets (200 mg total) by mouth daily.  30 tablet  1  . pantoprazole (PROTONIX) 40 MG tablet Take 1 tablet (40 mg total) by mouth daily.  90 tablet  3  . rosuvastatin (CRESTOR) 10 MG tablet Take 1 tablet (10 mg total) by mouth daily.  90 tablet  3   No current facility-administered medications on file prior to visit.    Allergies: No Known Allergies    Review of Systems: See HPI for pertinent ROS. All other ROS negative.    Physical Exam: Blood  pressure 100/60, pulse 82, temperature 97 F (36.1 C), temperature source Oral, resp. rate 18, height 5\' 3"  (1.6 m), weight 225 lb (102.059 kg)., Body mass index is 39.87 kg/(m^2). General:  Obese white female . Appears in no acute distress. Neck: Supple. No thyromegaly. No lymphadenopathy. Lungs: Clear bilaterally to auscultation without wheezes, rales, or rhonchi. Breathing is unlabored. Heart: Irregular rhythm.. Abdomen: Soft, non-tender, non-distended with normoactive bowel sounds. No hepatomegaly. No rebound/guarding. No obvious abdominal masses. There is absolutely no area of tenderness with firm palpation throughout. Msk:  Strength and tone normal for age. Extremities/Skin: Warm and dry. No clubbing or cyanosis. No edema. No rashes or suspicious lesions. Neuro: Alert and oriented X 3. Moves all extremities spontaneously. Gait is normal. CNII-XII grossly in  tact. Psych:  Responds to questions appropriately with a normal affect.     ASSESSMENT AND PLAN:  72 y.o. year old female with  1. Nausea with vomiting Will check CBC to make sure this is normal. I really think that her nausea and vomiting are secondary to her body getting used to the flecainide. She has no right upper quadrant tenderness to suggest gallbladder disease. No other area of tenderness on exam and she has been having no abdominal pain. If she has recurrent nausea or vomiting I have told her to inform cardiology. If they feel that this is not secondary to her flecainide and then she can follow up with [place] for further evaluation of cause. For now, she is having no symptoms that we will simply monitor. - CBC w/[facility] & 3 Part Diff  2. Atrial fibrillation    Signed, [address] [place], [place], [facility] 05/17/2013 4:14 PM

## 2013-05-20 ENCOUNTER — Other Ambulatory Visit (INDEPENDENT_AMBULATORY_CARE_PROVIDER_SITE_OTHER): Payer: Medicare Other

## 2013-05-20 ENCOUNTER — Other Ambulatory Visit: Payer: Self-pay | Admitting: *Deleted

## 2013-05-20 ENCOUNTER — Encounter: Payer: Self-pay | Admitting: *Deleted

## 2013-05-20 ENCOUNTER — Encounter (INDEPENDENT_AMBULATORY_CARE_PROVIDER_SITE_OTHER): Payer: Self-pay

## 2013-05-20 ENCOUNTER — Ambulatory Visit: Payer: Medicare Other | Admitting: Internal Medicine

## 2013-05-20 ENCOUNTER — Ambulatory Visit (INDEPENDENT_AMBULATORY_CARE_PROVIDER_SITE_OTHER): Payer: Medicare Other | Admitting: *Deleted

## 2013-05-20 VITALS — BP 118/74 | HR 96 | Ht 63.0 in | Wt 223.0 lb

## 2013-05-20 DIAGNOSIS — I4891 Unspecified atrial fibrillation: Secondary | ICD-10-CM

## 2013-05-20 LAB — CBC WITH DIFFERENTIAL/PLATELET
Basophils Absolute: 0 10*3/uL (ref 0.0–0.1)
Basophils Relative: 0.5 % (ref 0.0–3.0)
Eosinophils Absolute: 0 10*3/uL (ref 0.0–0.7)
Eosinophils Relative: 0.5 % (ref 0.0–5.0)
HCT: 33.4 % — ABNORMAL LOW (ref 36.0–46.0)
Hemoglobin: 11.3 g/dL — ABNORMAL LOW (ref 12.0–15.0)
MCHC: 33.9 g/dL (ref 30.0–36.0)
MCV: 85.1 fl (ref 78.0–100.0)
Monocytes Absolute: 0.6 10*3/uL (ref 0.1–1.0)
Neutro Abs: 5.6 10*3/uL (ref 1.4–7.7)
Neutrophils Relative %: 69 % (ref 43.0–77.0)
RBC: 3.93 Mil/uL (ref 3.87–5.11)
RDW: 14.1 % (ref 11.5–14.6)

## 2013-05-20 LAB — BASIC METABOLIC PANEL
CO2: 33 mEq/L — ABNORMAL HIGH (ref 19–32)
Chloride: 97 mEq/L (ref 96–112)
Creatinine, Ser: 0.8 mg/dL (ref 0.4–1.2)
Potassium: 3.1 mEq/L — ABNORMAL LOW (ref 3.5–5.1)
Sodium: 140 mEq/L (ref 135–145)

## 2013-05-20 NOTE — Patient Instructions (Signed)
Your physician has recommended that you have a Cardioversion (DCCV). Electrical Cardioversion uses a jolt of electricity to your heart either through paddles or wired patches attached to your chest. This is a controlled, usually prescheduled, procedure. Defibrillation is done under light anesthesia in the hospital, and you usually go home the day of the procedure. This is done to get your heart back into a normal rhythm. You are not awake for the procedure. Please see the instruction sheet given to you today.  Your physician recommends that you keep your follow-up appointment on: Wednesday 06/09/13 at 3:15 pm with Dr. Johney Frame.

## 2013-05-20 NOTE — Progress Notes (Signed)
The patient was in the office today for a follow up EKG due to an increase in her flecainide from 50 mg BID to 100 mg BID on 05/14/13. EKG reviewed by Dr. Johney Frame- a-fib with a rate of 96 bpm. Per Dr. Johney Frame, he recommends that the patient have a cardioversion scheduled to re-establish NSR. The patient is agreeable with this. She is schedule for Monday 11/24 with Dr. Tenny Craw at 11:00 am. Instructions were given to the patient. The patient did complain of vomiting intermittently for about 2 days after initiation of flecainide and after her dose was increased. Dr. Johney Frame discussed this with the patient and she feels that she can continue flecainide for a little while for DCCV. She will follow up with Dr. Johney Frame as schedule on 06/09/13. The patient is agreeable with all of the above.

## 2013-05-24 ENCOUNTER — Telehealth: Payer: Self-pay | Admitting: *Deleted

## 2013-05-24 ENCOUNTER — Encounter (HOSPITAL_COMMUNITY)
Admission: RE | Disposition: A | Discharge status: Home / Self Care | Payer: Self-pay | Source: Ambulatory Visit | Attending: Internal Medicine

## 2013-05-24 ENCOUNTER — Ambulatory Visit (HOSPITAL_COMMUNITY)
Admission: RE | Admit: 2013-05-24 | Discharge: 2013-05-24 | Disposition: A | Discharge status: Home / Self Care | Payer: Medicare Other | Source: Ambulatory Visit | Attending: Internal Medicine | Admitting: Internal Medicine

## 2013-05-24 ENCOUNTER — Ambulatory Visit (HOSPITAL_COMMUNITY): Payer: Medicare Other | Admitting: Certified Registered Nurse Anesthetist

## 2013-05-24 ENCOUNTER — Encounter (HOSPITAL_COMMUNITY): Payer: Medicare Other | Admitting: Certified Registered Nurse Anesthetist

## 2013-05-24 ENCOUNTER — Encounter (HOSPITAL_COMMUNITY): Payer: Self-pay | Admitting: *Deleted

## 2013-05-24 DIAGNOSIS — R0602 Shortness of breath: Secondary | ICD-10-CM | POA: Diagnosis not present

## 2013-05-24 DIAGNOSIS — I4891 Unspecified atrial fibrillation: Secondary | ICD-10-CM | POA: Insufficient documentation

## 2013-05-24 DIAGNOSIS — Z7901 Long term (current) use of anticoagulants: Secondary | ICD-10-CM | POA: Insufficient documentation

## 2013-05-24 DIAGNOSIS — E119 Type 2 diabetes mellitus without complications: Secondary | ICD-10-CM | POA: Insufficient documentation

## 2013-05-24 DIAGNOSIS — E876 Hypokalemia: Secondary | ICD-10-CM

## 2013-05-24 DIAGNOSIS — I10 Essential (primary) hypertension: Secondary | ICD-10-CM | POA: Insufficient documentation

## 2013-05-24 DIAGNOSIS — E785 Hyperlipidemia, unspecified: Secondary | ICD-10-CM | POA: Insufficient documentation

## 2013-05-24 HISTORY — PX: CARDIOVERSION: SHX1299

## 2013-05-24 LAB — BASIC METABOLIC PANEL
Chloride: 98 mEq/L (ref 96–112)
Creatinine, Ser: 0.83 mg/dL (ref 0.50–1.10)
GFR calc Af Amer: 80 mL/min — ABNORMAL LOW (ref >90)
Glucose, Bld: 145 mg/dL — ABNORMAL HIGH (ref 70–99)
Potassium: 3 mEq/L — ABNORMAL LOW (ref 3.5–5.1)
Sodium: 142 mEq/L (ref 135–145)

## 2013-05-24 LAB — GLUCOSE, CAPILLARY: Glucose-Capillary: 119 mg/dL — ABNORMAL HIGH (ref 70–99)

## 2013-05-24 SURGERY — CARDIOVERSION
Anesthesia: General

## 2013-05-24 MED ORDER — POTASSIUM CHLORIDE CRYS ER 20 MEQ PO TBCR: 20.0000 meq | EXTENDED_RELEASE_TABLET | Freq: Two times a day (BID) | ORAL | Status: DC

## 2013-05-24 MED ORDER — LIDOCAINE HCL (CARDIAC) 20 MG/ML IV SOLN
INTRAVENOUS | Status: DC | PRN
  Administered 2013-05-24: 60 mg via INTRAVENOUS

## 2013-05-24 MED ORDER — POTASSIUM CHLORIDE CRYS ER 20 MEQ PO TBCR
40.0000 meq | EXTENDED_RELEASE_TABLET | Freq: Once | ORAL | Status: AC
  Administered 2013-05-24: 40 meq via ORAL
  Filled 2013-05-24: qty 2

## 2013-05-24 MED ORDER — PROPOFOL 10 MG/ML IV BOLUS
INTRAVENOUS | Status: DC | PRN
  Administered 2013-05-24: 100 mg via INTRAVENOUS

## 2013-05-24 MED ORDER — SODIUM CHLORIDE 0.9 % IV SOLN
INTRAVENOUS | Status: DC | PRN
  Administered 2013-05-24: 11:00:00 via INTRAVENOUS

## 2013-05-24 MED ORDER — SODIUM CHLORIDE 0.9 % IV SOLN
INTRAVENOUS | Status: DC
  Administered 2013-05-24: 500 mL via INTRAVENOUS

## 2013-05-24 NOTE — Telephone Encounter (Signed)
Patient to start Potassium twice daily with a BMP tomorrow and in 1 week

## 2013-05-24 NOTE — Transfer of Care (Signed)
Immediate Anesthesia Transfer of Care Note  Patient: Sherry Vargas  Procedure(s) Performed: Procedure(s): CARDIOVERSION (N/A)  Patient Location: PACU and Endoscopy Unit  Anesthesia Type:General  Level of Consciousness: awake, oriented, patient cooperative and responds to stimulation  Airway & Oxygen Therapy: Patient Spontanous Breathing and Patient connected to nasal cannula oxygen  Post-op Assessment: Report given to PACU RN and Post -op Vital signs reviewed and stable  Post vital signs: Reviewed and stable  Complications: No apparent anesthesia complications

## 2013-05-24 NOTE — Anesthesia Postprocedure Evaluation (Signed)
  Anesthesia Post-op Note  Patient: Sherry Vargas  Procedure(s) Performed: Procedure(s): CARDIOVERSION (N/A)  Patient Location: PACU  Anesthesia Type:General  Level of Consciousness: awake, oriented, patient cooperative and responds to stimulation  Airway and Oxygen Therapy: Patient Spontanous Breathing and Patient connected to nasal cannula oxygen  Post-op Pain: none  Post-op Assessment: Post-op Vital signs reviewed, Patient's Cardiovascular Status Stable and Respiratory Function Stable  Post-op Vital Signs: Reviewed and stable  Complications: No apparent anesthesia complications

## 2013-05-24 NOTE — Telephone Encounter (Signed)
Message copied by Deliah Boston on Mon May 24, 2013 12:00 PM ------      Message from: Hillis Range      Created: Sat May 22, 2013  8:23 PM       Results reviewed.  Please inform pt of result.      Please start potassium 20 meq daily if she is not presently taking and repeat bmet in 2 weeks ------

## 2013-05-24 NOTE — Anesthesia Preprocedure Evaluation (Addendum)
Anesthesia Evaluation  Patient identified by MRN, date of birth, ID band Patient awake    Reviewed: Allergy & Precautions, H&P , NPO status , Patient's Chart, lab work & pertinent test results, reviewed documented beta blocker date and time   Airway Mallampati: II TM Distance: >3 FB Neck ROM: Full    Dental  (+) Teeth Intact and Dental Advisory Given   Pulmonary shortness of breath, former smoker,  breath sounds clear to auscultation        Cardiovascular hypertension, Pt. on medications and Pt. on home beta blockers + Peripheral Vascular Disease + dysrhythmias Atrial Fibrillation Rhythm:Regular Rate:Normal     Neuro/Psych    GI/Hepatic   Endo/Other  diabetes  Renal/GU      Musculoskeletal   Abdominal   Peds  Hematology   Anesthesia Other Findings   Reproductive/Obstetrics                          Anesthesia Physical Anesthesia Plan  ASA: III  Anesthesia Plan: General   Post-op Pain Management:    Induction: Intravenous  Airway Management Planned: Mask  Additional Equipment:   Intra-op Plan:   Post-operative Plan:   Informed Consent: I have reviewed the patients History and Physical, chart, labs and discussed the procedure including the risks, benefits and alternatives for the proposed anesthesia with the patient or authorized representative who has indicated his/her understanding and acceptance.   Dental advisory given  Plan Discussed with: Anesthesiologist, Surgeon and CRNA  Anesthesia Plan Comments:        Anesthesia Quick Evaluation

## 2013-05-24 NOTE — Interval H&P Note (Signed)
History and Physical Interval Note:  05/24/2013 10:41 AM  Sherry Vargas  has presented today for surgery, with the diagnosis of AFIB  The various methods of treatment have been discussed with the patient and family. After consideration of risks, benefits and other options for treatment, the patient has consented to  Procedure(s): CARDIOVERSION (N/A) as a surgical intervention .  The patient's history has been reviewed, patient examined, no change in status, stable for surgery.  I have reviewed the patient's chart and labs.  Questions were answered to the patient's satisfaction.     Dietrich Pates

## 2013-05-24 NOTE — Telephone Encounter (Signed)
Patient aware.  Will put in for labs

## 2013-05-24 NOTE — H&P (View-Only) (Signed)
PCP: Leo Grosser, MD  Sherry Vargas is a 72 y.o. female who presents today for routine electrophysiology followup.  She recently presented for cardioversion.  This was unsuccessful.  She reports ongoing symptoms of fatigue and decreased exercise tolerance.  Today, she denies symptoms of palpitations, chest pain, shortness of breath,  lower extremity edema, dizziness, presyncope, or syncope.  The patient is otherwise without complaint today.   Past Medical History  Diagnosis Date  . HLD (hyperlipidemia)   . DM (dermatomyositis)   . PAD (peripheral artery disease)   . 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 Paroxysmal a-fib     post operative following fem bypass at [facility] 12/11/10  . Hypertension   . Diabetes mellitus without complication    Past Surgical History  Procedure Laterality Date  . Adenoidectomy    . Breast lumpectomy      benign  . Knee cartilage surgery    . Intraoperative arteriogram      for claudication in L leg 2004 at [facility]  . Endovascular stents      R leg at [facility]  . Femoral bypass  12/11/10    R leg at [facility]  . Cardioversion N/A 04/27/2013    Procedure: CARDIOVERSION;  Surgeon: Wendall Stade, MD;  Location: [facility] ENDOSCOPY;  Service: Cardiovascular;  Laterality: N/A;    Current Outpatient Prescriptions  Medication Sig Dispense Refill  . apixaban (ELIQUIS) 5 MG TABS tablet Take 1 tablet (5 mg total) by mouth 2 (two) times daily.  60 tablet  11  . clopidogrel (PLAVIX) 75 MG tablet Take 1 tablet (75 mg total) by mouth daily.  90 tablet  3  . diltiazem (CARDIZEM CD) 120 MG 24 hr capsule Take 1 capsule (120 mg total) by mouth daily.  30 capsule  1  . furosemide (LASIX) 40 MG tablet Take 40 mg by mouth daily.      [name] glipiZIDE (GLUCOTROL) 5 MG tablet Take 0.5 tablets (2.5 mg total) by mouth 2 (two) times daily.  90 tablet  3  . IRON PO Take 65 mg by mouth daily.      [name] lisinopril (PRINIVIL,ZESTRIL) 5 MG tablet Take 1 tablet (5 mg total) by mouth  daily.  90 tablet  3  . LUTEIN-ZEAXANTHIN PO Take by mouth daily.        . metFORMIN (GLUCOPHAGE) 500 MG tablet Take 2 tablets (1,000 mg total) by mouth 2 (two) times daily with a meal.  360 tablet  3  . metoprolol succinate (TOPROL-XL) 100 MG 24 hr tablet Take 2 tablets (200 mg total) by mouth daily.  30 tablet  1  . pantoprazole (PROTONIX) 40 MG tablet Take 1 tablet (40 mg total) by mouth daily.  90 tablet  3  . rosuvastatin (CRESTOR) 10 MG tablet Take 1 tablet (10 mg total) by mouth daily.  90 tablet  3  . flecainide (TAMBOCOR) 50 MG tablet Take 1 tablet (50 mg total) by mouth 2 (two) times daily.  180 tablet  3   No current facility-administered medications for this visit.    Physical Exam: Filed Vitals:   04/29/13 1608  BP: 98/62  Pulse: 106  Height: 5\' 3"  (1.6 m)  Weight: 224 lb 1.9 oz (101.66 kg)    GEN- The patient is well appearing, alert and oriented x 3 today.   Head- normocephalic, atraumatic Eyes-  Sclera clear, conjunctiva pink Ears- hearing intact Oropharynx- clear Lungs- Clear to ausculation bilaterally, normal work of breathing  Heart- irregular rate and rhythm, no murmurs, rubs or gallops, PMI not laterally displaced GI- soft, NT, ND, + BS Extremities- no clubbing, cyanosis, + edema Neuro- nonfocal  ekg today reveals afib, V rate 106 bpm, nonspecific ST/T changes  Assessment and Plan:  1. Persistent afib The patient presents with symptomatic afib. V rates are improving slowly with medical therapy. Her CHADS2VASc score is at least 4.   She has been appropriately anticoagulated with eliquis 5mg  BID today.   As she does not have known CAD, I will start flecainide 50mg  BID today.  She will return in 2 weeks for an ekg.  If she is not in sinus then we will increase flecainide to 100mg  BID at that time.  If she is in sinus, then we will proceed with lexiscan myoview to evaluate for CAD.  2. PVD On plavix.  She is referred to Dr Darrick Penna to establish long term care.   I would like for Dr Darrick Penna to comment on the need for plavix long term.  As she will require anticoagulation fore afib, it would be nice to avoid antiplatelet therapy if possible.  If she requires antiplatelet therapy, then we will continue both plavix and eliquis.  3. Fatigue/ SOB Should improve with sinus rhythm Would anticipate myoview once in sinus to evaluate for ischemia as the cause  Return for an ekg in 2 weeks with plan as above I will see in 2 months

## 2013-05-24 NOTE — Preoperative (Signed)
Beta Blockers   Reason not to administer Beta Blockers:Not Applicable, took yesterday at 11am

## 2013-05-25 ENCOUNTER — Other Ambulatory Visit (INDEPENDENT_AMBULATORY_CARE_PROVIDER_SITE_OTHER): Payer: Medicare Other

## 2013-05-25 ENCOUNTER — Encounter (HOSPITAL_COMMUNITY): Payer: Self-pay | Admitting: Internal Medicine

## 2013-05-25 DIAGNOSIS — E876 Hypokalemia: Secondary | ICD-10-CM

## 2013-05-25 LAB — BASIC METABOLIC PANEL
CO2: 31 mEq/L (ref 19–32)
Chloride: 101 mEq/L (ref 96–112)
Creatinine, Ser: 1 mg/dL (ref 0.4–1.2)
Glucose, Bld: 136 mg/dL — ABNORMAL HIGH (ref 70–99)
Potassium: 3 mEq/L — ABNORMAL LOW (ref 3.5–5.1)

## 2013-05-26 ENCOUNTER — Inpatient Hospital Stay (HOSPITAL_COMMUNITY)
Admission: EM | Admit: 2013-05-26 | Discharge: 2013-06-02 | DRG: 308 | Disposition: A | Discharge status: Home Health | Payer: Medicare Other | Attending: Cardiology | Admitting: Cardiology

## 2013-05-26 ENCOUNTER — Emergency Department (HOSPITAL_COMMUNITY): Payer: Medicare Other

## 2013-05-26 ENCOUNTER — Encounter (HOSPITAL_COMMUNITY): Payer: Self-pay | Admitting: Emergency Medicine

## 2013-05-26 DIAGNOSIS — Z7901 Long term (current) use of anticoagulants: Secondary | ICD-10-CM

## 2013-05-26 DIAGNOSIS — R112 Nausea with vomiting, unspecified: Secondary | ICD-10-CM | POA: Diagnosis present

## 2013-05-26 DIAGNOSIS — K5900 Constipation, unspecified: Secondary | ICD-10-CM | POA: Diagnosis not present

## 2013-05-26 DIAGNOSIS — I10 Essential (primary) hypertension: Secondary | ICD-10-CM | POA: Diagnosis present

## 2013-05-26 DIAGNOSIS — Z833 Family history of diabetes mellitus: Secondary | ICD-10-CM

## 2013-05-26 DIAGNOSIS — I4891 Unspecified atrial fibrillation: Principal | ICD-10-CM | POA: Diagnosis present

## 2013-05-26 DIAGNOSIS — I5031 Acute diastolic (congestive) heart failure: Secondary | ICD-10-CM | POA: Diagnosis not present

## 2013-05-26 DIAGNOSIS — I70209 Unspecified atherosclerosis of native arteries of extremities, unspecified extremity: Secondary | ICD-10-CM | POA: Diagnosis present

## 2013-05-26 DIAGNOSIS — R5381 Other malaise: Secondary | ICD-10-CM | POA: Diagnosis present

## 2013-05-26 DIAGNOSIS — I739 Peripheral vascular disease, unspecified: Secondary | ICD-10-CM

## 2013-05-26 DIAGNOSIS — E785 Hyperlipidemia, unspecified: Secondary | ICD-10-CM | POA: Diagnosis present

## 2013-05-26 DIAGNOSIS — Z87891 Personal history of nicotine dependence: Secondary | ICD-10-CM

## 2013-05-26 DIAGNOSIS — E119 Type 2 diabetes mellitus without complications: Secondary | ICD-10-CM | POA: Diagnosis present

## 2013-05-26 DIAGNOSIS — R0602 Shortness of breath: Secondary | ICD-10-CM | POA: Diagnosis present

## 2013-05-26 DIAGNOSIS — E8809 Other disorders of plasma-protein metabolism, not elsewhere classified: Secondary | ICD-10-CM | POA: Diagnosis present

## 2013-05-26 DIAGNOSIS — I509 Heart failure, unspecified: Secondary | ICD-10-CM | POA: Insufficient documentation

## 2013-05-26 DIAGNOSIS — I279 Pulmonary heart disease, unspecified: Secondary | ICD-10-CM

## 2013-05-26 DIAGNOSIS — I5032 Chronic diastolic (congestive) heart failure: Secondary | ICD-10-CM | POA: Diagnosis present

## 2013-05-26 DIAGNOSIS — Z801 Family history of malignant neoplasm of trachea, bronchus and lung: Secondary | ICD-10-CM

## 2013-05-26 DIAGNOSIS — Z23 Encounter for immunization: Secondary | ICD-10-CM

## 2013-05-26 DIAGNOSIS — Z79899 Other long term (current) drug therapy: Secondary | ICD-10-CM

## 2013-05-26 DIAGNOSIS — Z8673 Personal history of transient ischemic attack (TIA), and cerebral infarction without residual deficits: Secondary | ICD-10-CM

## 2013-05-26 DIAGNOSIS — D649 Anemia, unspecified: Secondary | ICD-10-CM | POA: Diagnosis present

## 2013-05-26 DIAGNOSIS — D72829 Elevated white blood cell count, unspecified: Secondary | ICD-10-CM | POA: Diagnosis present

## 2013-05-26 LAB — COMPREHENSIVE METABOLIC PANEL
ALT: 32 U/L (ref 0–35)
AST: 39 U/L — ABNORMAL HIGH (ref 0–37)
Albumin: 3.5 g/dL (ref 3.5–5.2)
Alkaline Phosphatase: 103 U/L (ref 39–117)
BUN: 28 mg/dL — ABNORMAL HIGH (ref 6–23)
Calcium: 7.7 mg/dL — ABNORMAL LOW (ref 8.4–10.5)
Chloride: 94 mEq/L — ABNORMAL LOW (ref 96–112)
Potassium: 3.2 mEq/L — ABNORMAL LOW (ref 3.5–5.1)
Total Bilirubin: 0.5 mg/dL (ref 0.3–1.2)

## 2013-05-26 LAB — CBC
MCV: 88.8 fL (ref 78.0–100.0)
Platelets: 332 10*3/uL (ref 150–400)
RBC: 3.66 MIL/uL — ABNORMAL LOW (ref 3.87–5.11)
WBC: 14.6 10*3/uL — ABNORMAL HIGH (ref 4.0–10.5)

## 2013-05-26 LAB — GLUCOSE, CAPILLARY
Glucose-Capillary: 116 mg/dL — ABNORMAL HIGH (ref 70–99)
Glucose-Capillary: 165 mg/dL — ABNORMAL HIGH (ref 70–99)
Glucose-Capillary: 273 mg/dL — ABNORMAL HIGH (ref 70–99)

## 2013-05-26 LAB — TROPONIN I
Troponin I: <0.3 ng/mL (ref <0.30)
Troponin I: <0.3 ng/mL (ref <0.30)

## 2013-05-26 LAB — BLOOD GAS, ARTERIAL
Acid-base deficit: 0.3 mmol/L (ref 0.0–2.0)
Bicarbonate: 24.4 mEq/L — ABNORMAL HIGH (ref 20.0–24.0)
Drawn by: 38235
FIO2: 1 %
O2 Saturation: 88 %
Patient temperature: 37
pCO2 arterial: 43.9 mmHg (ref 35.0–45.0)
pH, Arterial: 7.363 (ref 7.350–7.450)

## 2013-05-26 LAB — PROTIME-INR
INR: 1.29 (ref 0.00–1.49)
Prothrombin Time: 15.8 seconds — ABNORMAL HIGH (ref 11.6–15.2)

## 2013-05-26 LAB — POCT I-STAT, CHEM 8
Calcium, Ion: 0.88 mmol/L — ABNORMAL LOW (ref 1.13–1.30)
Creatinine, Ser: 1.2 mg/dL — ABNORMAL HIGH (ref 0.50–1.10)
Glucose, Bld: 235 mg/dL — ABNORMAL HIGH (ref 70–99)
HCT: 35 % — ABNORMAL LOW (ref 36.0–46.0)
Hemoglobin: 11.9 g/dL — ABNORMAL LOW (ref 12.0–15.0)
Potassium: 3.3 mEq/L — ABNORMAL LOW (ref 3.5–5.1)
TCO2: 22 mmol/L (ref 0–100)

## 2013-05-26 MED ORDER — FLECAINIDE ACETATE 100 MG PO TABS
100.0000 mg | ORAL_TABLET | Freq: Two times a day (BID) | ORAL | Status: DC
  Administered 2013-05-26 – 2013-05-27 (×3): 100 mg via ORAL
  Filled 2013-05-26 (×4): qty 1

## 2013-05-26 MED ORDER — SODIUM CHLORIDE 0.9 % IJ SOLN: 3.0000 mL | INTRAMUSCULAR | Status: DC | PRN

## 2013-05-26 MED ORDER — ONDANSETRON HCL 4 MG/2ML IJ SOLN: 4.0000 mg | Freq: Once | INTRAMUSCULAR | Status: DC

## 2013-05-26 MED ORDER — PANTOPRAZOLE SODIUM 40 MG PO TBEC
40.0000 mg | DELAYED_RELEASE_TABLET | Freq: Every day | ORAL | Status: DC
  Administered 2013-05-26 – 2013-06-02 (×8): 40 mg via ORAL
  Filled 2013-05-26 (×5): qty 1

## 2013-05-26 MED ORDER — DILTIAZEM HCL 100 MG IV SOLR
5.0000 mg/h | INTRAVENOUS | Status: DC
  Administered 2013-05-26: 15 mg/h via INTRAVENOUS
  Administered 2013-05-26: 09:00:00 5 mg/h via INTRAVENOUS
  Administered 2013-05-26 – 2013-05-27 (×2): 10 mg/h via INTRAVENOUS
  Filled 2013-05-26 (×4): qty 100

## 2013-05-26 MED ORDER — SODIUM CHLORIDE 0.9 % IV SOLN: 250.0000 mL | INTRAVENOUS | Status: DC | PRN

## 2013-05-26 MED ORDER — NITROGLYCERIN IN D5W 200-5 MCG/ML-% IV SOLN
2.0000 ug/min | INTRAVENOUS | Status: DC
  Administered 2013-05-26: 10 ug/min via INTRAVENOUS
  Administered 2013-05-26: 5 ug/min via INTRAVENOUS
  Filled 2013-05-26: qty 250

## 2013-05-26 MED ORDER — METOPROLOL SUCCINATE ER 100 MG PO TB24
200.0000 mg | ORAL_TABLET | Freq: Every day | ORAL | Status: DC
  Administered 2013-05-26 – 2013-06-02 (×7): 200 mg via ORAL
  Filled 2013-05-26 (×9): qty 2

## 2013-05-26 MED ORDER — CLOPIDOGREL BISULFATE 75 MG PO TABS
75.0000 mg | ORAL_TABLET | Freq: Every day | ORAL | Status: DC
  Administered 2013-05-26 – 2013-05-31 (×6): 75 mg via ORAL
  Filled 2013-05-26 (×7): qty 1

## 2013-05-26 MED ORDER — FUROSEMIDE 10 MG/ML IJ SOLN
40.0000 mg | Freq: Once | INTRAMUSCULAR | Status: AC
  Administered 2013-05-26: 40 mg via INTRAVENOUS
  Filled 2013-05-26: qty 4

## 2013-05-26 MED ORDER — SODIUM CHLORIDE 0.9 % IJ SOLN
3.0000 mL | Freq: Two times a day (BID) | INTRAMUSCULAR | Status: DC
  Administered 2013-05-26 – 2013-06-01 (×9): 3 mL via INTRAVENOUS

## 2013-05-26 MED ORDER — APIXABAN 5 MG PO TABS
5.0000 mg | ORAL_TABLET | Freq: Two times a day (BID) | ORAL | Status: DC
  Administered 2013-05-26 – 2013-06-02 (×15): 5 mg via ORAL
  Filled 2013-05-26 (×16): qty 1

## 2013-05-26 MED ORDER — INSULIN ASPART 100 UNIT/ML ~~LOC~~ SOLN
0.0000 [IU] | Freq: Every day | SUBCUTANEOUS | Status: DC
  Administered 2013-05-26: 3 [IU] via SUBCUTANEOUS

## 2013-05-26 MED ORDER — DILTIAZEM HCL ER COATED BEADS 120 MG PO CP24
120.0000 mg | ORAL_CAPSULE | Freq: Every day | ORAL | Status: DC
  Filled 2013-05-26 (×2): qty 1

## 2013-05-26 MED ORDER — FUROSEMIDE 10 MG/ML IJ SOLN: 60.0000 mg | Freq: Two times a day (BID) | INTRAMUSCULAR | Status: DC

## 2013-05-26 MED ORDER — ASPIRIN EC 81 MG PO TBEC
81.0000 mg | DELAYED_RELEASE_TABLET | Freq: Every day | ORAL | Status: DC
  Administered 2013-05-26 – 2013-05-27 (×2): 81 mg via ORAL
  Filled 2013-05-26 (×2): qty 1

## 2013-05-26 MED ORDER — POTASSIUM CHLORIDE CRYS ER 20 MEQ PO TBCR
40.0000 meq | EXTENDED_RELEASE_TABLET | Freq: Once | ORAL | Status: AC
  Administered 2013-05-26: 40 meq via ORAL
  Filled 2013-05-26: qty 2

## 2013-05-26 MED ORDER — LISINOPRIL 5 MG PO TABS
5.0000 mg | ORAL_TABLET | Freq: Every day | ORAL | Status: DC
  Administered 2013-05-26 – 2013-05-28 (×3): 5 mg via ORAL
  Filled 2013-05-26 (×4): qty 1

## 2013-05-26 MED ORDER — POTASSIUM CHLORIDE CRYS ER 20 MEQ PO TBCR
20.0000 meq | EXTENDED_RELEASE_TABLET | Freq: Two times a day (BID) | ORAL | Status: DC
  Administered 2013-05-26 – 2013-05-27 (×3): 20 meq via ORAL
  Filled 2013-05-26 (×4): qty 1

## 2013-05-26 MED ORDER — FUROSEMIDE 10 MG/ML IJ SOLN
40.0000 mg | Freq: Two times a day (BID) | INTRAMUSCULAR | Status: DC
  Administered 2013-05-26 – 2013-05-27 (×2): 40 mg via INTRAVENOUS
  Filled 2013-05-26 (×4): qty 4

## 2013-05-26 MED ORDER — PNEUMOCOCCAL VAC POLYVALENT 25 MCG/0.5ML IJ INJ
0.5000 mL | INJECTION | INTRAMUSCULAR | Status: AC
  Administered 2013-05-28: 0.5 mL via INTRAMUSCULAR
  Filled 2013-05-26 (×2): qty 0.5

## 2013-05-26 MED ORDER — ONDANSETRON HCL 4 MG/2ML IJ SOLN
4.0000 mg | Freq: Four times a day (QID) | INTRAMUSCULAR | Status: DC | PRN
  Administered 2013-05-27 – 2013-05-31 (×3): 4 mg via INTRAVENOUS
  Filled 2013-05-26 (×3): qty 2

## 2013-05-26 MED ORDER — INSULIN ASPART 100 UNIT/ML ~~LOC~~ SOLN
0.0000 [IU] | Freq: Three times a day (TID) | SUBCUTANEOUS | Status: DC
  Administered 2013-05-27: 3 [IU] via SUBCUTANEOUS
  Administered 2013-05-27: 09:00:00 2 [IU] via SUBCUTANEOUS
  Administered 2013-05-27: 3 [IU] via SUBCUTANEOUS
  Administered 2013-05-28 (×3): 2 [IU] via SUBCUTANEOUS
  Administered 2013-05-29: 07:00:00 1 [IU] via SUBCUTANEOUS
  Administered 2013-05-29 – 2013-05-30 (×4): 2 [IU] via SUBCUTANEOUS
  Administered 2013-05-31: 1 [IU] via SUBCUTANEOUS
  Administered 2013-05-31: 2 [IU] via SUBCUTANEOUS
  Administered 2013-06-01 (×2): 1 [IU] via SUBCUTANEOUS

## 2013-05-26 MED ORDER — ACETAMINOPHEN 325 MG PO TABS: 650.0000 mg | ORAL_TABLET | ORAL | Status: DC | PRN

## 2013-05-26 MED ORDER — POTASSIUM CHLORIDE 10 MEQ/100ML IV SOLN
10.0000 meq | Freq: Once | INTRAVENOUS | Status: AC
  Administered 2013-05-26: 10 meq via INTRAVENOUS
  Filled 2013-05-26: qty 100

## 2013-05-26 MED ORDER — TRAMADOL HCL 50 MG PO TABS
50.0000 mg | ORAL_TABLET | Freq: Four times a day (QID) | ORAL | Status: DC | PRN
  Administered 2013-05-26 – 2013-05-30 (×7): 50 mg via ORAL
  Filled 2013-05-26 (×7): qty 1

## 2013-05-26 MED ORDER — ATORVASTATIN CALCIUM 20 MG PO TABS
20.0000 mg | ORAL_TABLET | Freq: Every day | ORAL | Status: DC
  Administered 2013-05-26 – 2013-06-01 (×7): 20 mg via ORAL
  Filled 2013-05-26 (×8): qty 1

## 2013-05-26 NOTE — Care Management Note (Signed)
    Page 1 of 2   05/26/2013     3:00:48 PM   CARE MANAGEMENT NOTE 05/26/2013  Patient:  Sherry Vargas, Sherry Vargas   Account Number:  [id number]  Date Initiated:  05/26/2013  Documentation initiated by:  [facility]  Subjective/Objective Assessment:   72 y.o. female.  Chief Complaint: Shortness of breath//home with daughter Sherry Vargas [phone]     Action/Plan:   assess response of NTG gtt and lasix  - trend troponins  //Home with [facility]   Anticipated DC Date:  05/28/2013   Anticipated DC Plan:  HOME W HOME HEALTH SERVICES  In-house referral  Nutrition      DC Planning Services  CM consult      [facility] Choice  HOME HEALTH   Choice offered to / List presented to:  C-1 Patient        HH arranged  HH-1 RN  HH-10 DISEASE MANAGEMENT  HH-2 PT  HH-4 NURSE'S AIDE  HH-6 SOCIAL WORKER      HH agency  [facility]   Status of service:   Medicare Important Message given?   (If response is "NO", the following Medicare IM given date fields will be blank) Date Medicare IM given:   Date Additional Medicare IM given:    Discharge Disposition:    Per UR Regulation:    If discussed at Long Length of Stay Meetings, dates discussed:    Comments:  05/26/13 1415 Sherry Cohn, RN, BSN, [employer] [phone] Spoke with pt at bedside regarding discharge planning for [facility]. Offered pt list of home health agencies to choose from.  Pt chose [facility] to render services of RN/PT/SW. Sherry Furnish, RN of [facility] notified.  No DME needs identified at this time.

## 2013-05-26 NOTE — ED Provider Notes (Signed)
CSN: [id number]     Arrival date & time 05/26/13  0101 History   First MD Initiated Contact with Patient 05/26/13 0114     Chief Complaint  Patient presents with  . Shortness of Breath  . Nausea   (Consider location/radiation/quality/duration/timing/severity/associated sxs/prior Treatment) HPI History provided by patient. Has history of paroxysmal atrial fibrillation, diabetes and hyperlipidemia. Was admitted to [facility] and discharged yesterday with unsuccessful cardioversion. Patient was previously started on Eliquis and flecainide.  Tonight at home developed shortness of breath, rapidly became worse and presents here. No chest pain. No fevers or chills. No cough or recent illness otherwise. Some nausea but no vomiting.  She denies any history of similar symptoms in the past. She denies any leg swelling or recent weight gain. Symptoms moderate to severe. Past Medical History  Diagnosis Date  . HLD (hyperlipidemia)   . DM (dermatomyositis)   . PAD (peripheral artery disease)   . 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 Paroxysmal a-fib     post operative following fem bypass at [facility] 12/11/10  . Hypertension   . Diabetes mellitus without complication   . Stroke    Past Surgical History  Procedure Laterality Date  . Adenoidectomy    . Breast lumpectomy      benign  . Knee cartilage surgery    . Intraoperative arteriogram      for claudication in L leg 2004 at [facility]  . Endovascular stents      R leg at [facility]  . Femoral bypass  12/11/10    R leg at [facility]   . Cardioversion N/A 04/27/2013    Procedure: CARDIOVERSION;  Surgeon: Wendall Stade, MD;  Location: [facility] ENDOSCOPY;  Service: Cardiovascular;  Laterality: N/A;  . Cardioversion N/A 05/24/2013    Procedure: CARDIOVERSION;  Surgeon: Pricilla Riffle, MD;  Location: [facility] ENDOSCOPY;  Service: Cardiovascular;  Laterality: N/A;   Family History  Problem Relation Age of Onset  . Lung cancer Mother   . Diabetes Father     a fib  also   History  Substance Use Topics  . Smoking status: Former Games developer  . Smokeless tobacco: Not on file     Comment: quit 2005  . Alcohol Use: Yes     Comment: occasionally   OB History   Grav Para Term Preterm Abortions TAB SAB Ect Mult Living                 Review of Systems  Constitutional: Negative for fever and chills.  Eyes: Negative for visual disturbance.  Respiratory: Positive for shortness of breath.   Cardiovascular: Negative for chest pain.  Gastrointestinal: Negative for vomiting and abdominal pain.  Genitourinary: Negative for dysuria.  Musculoskeletal: Negative for back pain, neck pain and neck stiffness.  Skin: Negative for rash.  Neurological: Negative for headaches.  All other systems reviewed and are negative.    Allergies  Review of patient's allergies indicates no known allergies.  Home Medications   Current Outpatient Rx  Name  Route  Sig  Dispense  Refill  . apixaban (ELIQUIS) 5 MG TABS tablet   Oral   Take 1 tablet (5 mg total) by mouth 2 (two) times daily.   60 tablet   11   . clopidogrel (PLAVIX) 75 MG tablet   Oral   Take 1 tablet (75 mg total) by mouth daily.   90 tablet   3   . diltiazem (CARDIZEM CD) 120 MG 24 hr capsule  Oral   Take 1 capsule (120 mg total) by mouth daily.   30 capsule   1   . flecainide (TAMBOCOR) 100 MG tablet   Oral   Take 1 tablet (100 mg total) by mouth 2 (two) times daily.   180 tablet   3   . furosemide (LASIX) 40 MG tablet   Oral   Take 40 mg by mouth daily.         [name] glipiZIDE (GLUCOTROL) 5 MG tablet   Oral   Take 0.5 tablets (2.5 mg total) by mouth 2 (two) times daily.   90 tablet   3   . IRON PO   Oral   Take 325 mg by mouth daily.          [name] lisinopril (PRINIVIL,ZESTRIL) 5 MG tablet   Oral   Take 1 tablet (5 mg total) by mouth daily.   90 tablet   3   . LUTEIN-ZEAXANTHIN PO   Oral   Take by mouth daily.           . metFORMIN (GLUCOPHAGE) 500 MG tablet   Oral   Take  2 tablets (1,000 mg total) by mouth 2 (two) times daily with a meal.   360 tablet   3   . metoprolol succinate (TOPROL-XL) 100 MG 24 hr tablet   Oral   Take 2 tablets (200 mg total) by mouth daily.   30 tablet   1   . pantoprazole (PROTONIX) 40 MG tablet   Oral   Take 1 tablet (40 mg total) by mouth daily.   90 tablet   3   . potassium chloride SA (K-DUR,KLOR-CON) 20 MEQ tablet   Oral   Take 1 tablet (20 mEq total) by mouth 2 (two) times daily.   180 tablet   3   . rosuvastatin (CRESTOR) 10 MG tablet   Oral   Take 1 tablet (10 mg total) by mouth daily.   90 tablet   3    BP 143/55  Pulse 84  Resp 44  Wt 223 lb (101.152 kg)  SpO2 70% Physical Exam  Constitutional: She is oriented to person, place, and time. She appears well-developed and well-nourished.  HENT:  Head: Normocephalic and atraumatic.  Mouth/Throat: Oropharynx is clear and moist.  Eyes: EOM are normal. Pupils are equal, round, and reactive to light.  Neck: Neck supple. No tracheal deviation present.  Cardiovascular: Normal rate, regular rhythm and intact distal pulses.   Pulmonary/Chest: No stridor.  Tachypnea with moderate respiratory distress and decreased bilateral breath sounds  Abdominal: Soft. She exhibits no distension. There is no tenderness.  Musculoskeletal: Normal range of motion.  Trace bilateral pretibial edema  Neurological: She is alert and oriented to person, place, and time.  Skin: Skin is warm and dry.    ED Course  Procedures (including critical care time) Labs Review Labs Reviewed  CBC - Abnormal; Notable for the following:    WBC 14.6 (*)    RBC 3.66 (*)    Hemoglobin 10.6 (*)    HCT 32.5 (*)    All other components within normal limits  PRO B NATRIURETIC PEPTIDE - Abnormal; Notable for the following:    Pro B Natriuretic peptide (BNP) 1097.0 (*)    All other components within normal limits  COMPREHENSIVE METABOLIC PANEL - Abnormal; Notable for the following:     Potassium 3.2 (*)    Chloride 94 (*)    Glucose, Bld 249 (*)  BUN 28 (*)    Calcium 7.7 (*)    AST 39 (*)    GFR calc non Af Amer 54 (*)    GFR calc Af Amer 63 (*)    All other components within normal limits  BLOOD GAS, ARTERIAL - Abnormal; Notable for the following:    pO2, Arterial 62.0 (*)    Bicarbonate 24.4 (*)    All other components within normal limits  POCT I-STAT, CHEM 8 - Abnormal; Notable for the following:    Potassium 3.3 (*)    BUN 28 (*)    Creatinine, Ser 1.20 (*)    Glucose, Bld 235 (*)    Calcium, Ion 0.88 (*)    Hemoglobin 11.9 (*)    HCT 35.0 (*)    All other components within normal limits  TROPONIN I  LACTIC ACID, PLASMA   Imaging Review Dg Chest Portable 1 View  05/26/2013   CLINICAL DATA:  Shortness of breath and nausea tonight.  EXAM: PORTABLE CHEST - 1 VIEW  COMPARISON:  None.  FINDINGS: Mild cardiac enlargement with pulmonary vascular congestion. Interstitial changes suggest interstitial edema. No focal consolidation. No blunting of costophrenic angles. No pneumothorax.  IMPRESSION: Cardiac enlargement with pulmonary vascular congestion and interstitial edema.   Electronically Signed   By: Burman Nieves M.D.   On: 05/26/2013 01:40     Date: 05/26/2013  Rate: 78  Rhythm: normal sinus rhythm  QRS Axis: normal  Intervals: QT prolonged  ST/T Wave abnormalities: nonspecific ST changes  Conduction Disutrbances:none  Narrative Interpretation: Normal sinus, low voltage, EKG from 04/27/2013 shows A. fib  Old EKG Reviewed: unchanged  CRITICAL CARE Performed by: Sunnie Nielsen Total critical care time: 45 Critical care time was exclusive of separately billable procedures and treating other patients. Critical care was necessary to treat or prevent imminent or life-threatening deterioration. Critical care was time spent personally by me on the following activities: development of treatment plan with patient and/or surrogate as well as nursing,  discussions with consultants, evaluation of patient's response to treatment, examination of patient, obtaining history from patient or surrogate, ordering and performing treatments and interventions, ordering and review of laboratory studies, ordering and review of radiographic studies, pulse oximetry and re-evaluation of patient's condition.  IV lasix. Potassium provided NTG drip 2:03 AM CAR consulted, d/w Dr Tresa Endo, plan admit SDU at [facility] MDM  Diagnosis: Acute pulmonary edema  Requiring supp Oxygen EKG, labs, chest x-ray IV nitroglycerin glycerin drip Cardiology admit [place]     Sunnie Nielsen, MD 05/26/13 [phone]

## 2013-05-26 NOTE — Progress Notes (Signed)
Inpatient Diabetes Program Recommendations  AACE/ADA: New Consensus Statement on Inpatient Glycemic Control (2013)  Target Ranges:  Prepandial:   less than 140 mg/dL      Peak postprandial:   less than 180 mg/dL (1-2 hours)      Critically ill patients:  140 - 180 mg/dL    Inpatient Diabetes Program Recommendations Correction (SSI): start sensitive scale TID  Diet: add carbohydrate modified to current heart healthy diet Thank you  Piedad Climes BSN, RN,CDE Inpatient Diabetes Coordinator [phone] (team pager)

## 2013-05-26 NOTE — H&P (Signed)
Sherry Vargas is an 72 y.o. female.   Chief Complaint: Shortness of breath Cardiologists: Clinton Gallant  HPI: Sherry Vargas is a 72 yo woman with PMH of dyslipidemia, T2DM, atrial fibrillation, peripheral vascular disease who came into [facility] with significant shortness of breath and found to have pulmonary edema. She recently (05/24/13) had an elective cardioversion into NSR with other recent DCCV without success. At [facility] she was started on NTG gtt and lasix and transferred to [facility]. She tells me she had some acute SOB Monday night that lasted 2 hours but it went away. Late day/night of admission she felt the shortness of breath with chest tightness come on again and it last a few hours leading to presentation. She's never really had this set of symptoms before. She doesn't know if she's in sinus rhythm or not. At [facility] she received NTG and lasix and she feels much better now currently on 4L [place].   Past Medical History  Diagnosis Date  . HLD (hyperlipidemia)   . DM (dermatomyositis)   . PAD (peripheral artery disease)   . 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 Paroxysmal a-fib     post operative following fem bypass at [facility] 12/11/10  . Hypertension   . Diabetes mellitus without complication   . Stroke     Past Surgical History  Procedure Laterality Date  . Adenoidectomy    . Breast lumpectomy      benign  . Knee cartilage surgery    . Intraoperative arteriogram      for claudication in L leg 2004 at [facility]  . Endovascular stents      R leg at [facility]  . Femoral bypass  12/11/10    R leg at [facility]  . Cardioversion N/A 04/27/2013    Procedure: CARDIOVERSION;  Surgeon: Wendall Stade, MD;  Location: [facility] ENDOSCOPY;  Service: Cardiovascular;  Laterality: N/A;  . Cardioversion N/A 05/24/2013    Procedure: CARDIOVERSION;  Surgeon: Pricilla Riffle, MD;  Location: [facility] ENDOSCOPY;  Service: Cardiovascular;  Laterality: N/A;    Family History  Problem Relation Age of Onset  .  Lung cancer Mother   . Diabetes Father     a fib also   Social History:  reports that she has quit smoking. She does not have any smokeless tobacco history on file. She reports that she drinks alcohol. She reports that she does not use illicit drugs.  Allergies: No Known Allergies   (Not in a hospital admission)  Results for orders placed during the hospital encounter of 05/26/13 (from the past 48 hour(s))  CBC     Status: Abnormal   Collection Time    05/26/13  1:15 AM      Result Value Range   WBC 14.6 (*) 4.0 - 10.5 K/uL   RBC 3.66 (*) 3.87 - 5.11 MIL/uL   Hemoglobin 10.6 (*) 12.0 - 15.0 g/dL   HCT 69.6 (*) 29.5 - 28.4 %   MCV 88.8  78.0 - 100.0 fL   MCH 29.0  26.0 - 34.0 pg   MCHC 32.6  30.0 - 36.0 g/dL   RDW 13.2  44.0 - 10.2 %   Platelets 332  150 - 400 K/uL  PRO B NATRIURETIC PEPTIDE     Status: Abnormal   Collection Time    05/26/13  1:15 AM      Result Value Range   Pro B Natriuretic peptide (BNP) 1097.0 (*) 0 - 125 pg/mL  COMPREHENSIVE METABOLIC PANEL     Status: Abnormal   Collection Time    05/26/13  1:15 AM      Result Value Range   Sodium 136  135 - 145 mEq/L   Potassium 3.2 (*) 3.5 - 5.1 mEq/L   Chloride 94 (*) 96 - 112 mEq/L   CO2 23  19 - 32 mEq/L   Glucose, Bld 249 (*) 70 - 99 mg/dL   BUN 28 (*) 6 - 23 mg/dL   Creatinine, Ser 1.61  0.50 - 1.10 mg/dL   Calcium 7.7 (*) 8.4 - 10.5 mg/dL   Total Protein 7.2  6.0 - 8.3 g/dL   Albumin 3.5  3.5 - 5.2 g/dL   AST 39 (*) 0 - 37 U/L   ALT 32  0 - 35 U/L   Alkaline Phosphatase 103  39 - 117 U/L   Total Bilirubin 0.5  0.3 - 1.2 mg/dL   GFR calc non Af Amer 54 (*) >90 mL/min   GFR calc Af Amer 63 (*) >90 mL/min   Comment: (NOTE)     The eGFR has been calculated using the CKD EPI equation.     This calculation has not been validated in all clinical situations.     eGFR's persistently <90 mL/min signify possible Chronic Kidney     Disease.  TROPONIN I     Status: None   Collection Time    05/26/13  1:15 AM       Result Value Range   Troponin I <0.30  <0.30 ng/mL   Comment:            Due to the release kinetics of cTnI,     a negative result within the first hours     of the onset of symptoms does not rule out     myocardial infarction with certainty.     If myocardial infarction is still suspected,     repeat the test at appropriate intervals.  BLOOD GAS, ARTERIAL     Status: Abnormal   Collection Time    05/26/13  1:16 AM      Result Value Range   FIO2 1.00     Delivery systems NON-REBREATHER OXYGEN MASK     pH, Arterial 7.363  7.350 - 7.450   pCO2 arterial 43.9  35.0 - 45.0 mmHg   pO2, Arterial 62.0 (*) 80.0 - 100.0 mmHg   Bicarbonate 24.4 (*) 20.0 - 24.0 mEq/L   TCO2 22.7  0 - 100 mmol/L   Acid-base deficit 0.3  0.0 - 2.0 mmol/L   O2 Saturation 88.0     Patient temperature 37.0     Collection site RIGHT RADIAL     Drawn by [phone]     Sample type ARTERIAL DRAW     Allens test (pass/fail) PASS  PASS  POCT I-STAT, CHEM 8     Status: Abnormal   Collection Time    05/26/13  1:34 AM      Result Value Range   Sodium 141  135 - 145 mEq/L   Potassium 3.3 (*) 3.5 - 5.1 mEq/L   Chloride 99  96 - 112 mEq/L   BUN 28 (*) 6 - 23 mg/dL   Creatinine, Ser 5.40 (*) 0.50 - 1.10 mg/dL   Glucose, Bld 981 (*) 70 - 99 mg/dL   Calcium, Ion 1.91 (*) 1.13 - 1.30 mmol/L   TCO2 22  0 - 100 mmol/L   Hemoglobin 11.9 (*) 12.0 - 15.0 g/dL  HCT 35.0 (*) 36.0 - 46.0 %  LACTIC ACID, PLASMA     Status: None   Collection Time    05/26/13  2:16 AM      Result Value Range   Lactic Acid, Venous 2.0  0.5 - 2.2 mmol/L   Dg Chest Portable 1 View  05/26/2013   CLINICAL DATA:  Shortness of breath and nausea tonight.  EXAM: PORTABLE CHEST - 1 VIEW  COMPARISON:  None.  FINDINGS: Mild cardiac enlargement with pulmonary vascular congestion. Interstitial changes suggest interstitial edema. No focal consolidation. No blunting of costophrenic angles. No pneumothorax.  IMPRESSION: Cardiac enlargement with pulmonary  vascular congestion and interstitial edema.   Electronically Signed   By: Burman Nieves M.D.   On: 05/26/2013 01:40    Review of Systems  Constitutional: Positive for malaise/fatigue. Negative for fever, chills and weight loss.  HENT: Negative for tinnitus.   Eyes: Negative for double vision and photophobia.  Respiratory: Positive for shortness of breath. Negative for cough and hemoptysis.   Cardiovascular: Positive for chest pain, orthopnea and leg swelling. Negative for palpitations.  Gastrointestinal: Negative for nausea and vomiting.  Genitourinary: Negative for dysuria and urgency.  Musculoskeletal: Negative for myalgias and neck pain.  Skin: Negative for rash.  Neurological: Negative for dizziness, tingling, tremors and headaches.  Endo/Heme/Allergies: Negative for environmental allergies. Bruises/bleeds easily.  Psychiatric/Behavioral: Negative for depression, suicidal ideas and substance abuse.    Blood pressure 125/49, pulse 84, temperature 100.6 F (38.1 C), temperature source Rectal, resp. rate 29, weight 101.152 kg (223 lb), SpO2 70.00%. Physical Exam  Nursing note and vitals reviewed. Constitutional: She is oriented to person, place, and time. She appears well-developed and well-nourished. No distress.  Neck: Normal range of motion. Neck supple. JVD present. No thyromegaly present.  JVP to just under mandible  Cardiovascular: Normal rate, regular rhythm, normal heart sounds and intact distal pulses.   No murmur heard. Respiratory: Effort normal. No respiratory distress. She has no wheezes. She has rales.  Scattered occasional rales at the bases  GI: Soft. Bowel sounds are normal. She exhibits no distension. There is no tenderness. There is no rebound.  Musculoskeletal: Normal range of motion. She exhibits edema. She exhibits no tenderness.  Trace edema bilaterally  Neurological: She is alert and oriented to person, place, and time. No cranial nerve deficit.  Skin:  Skin is warm and dry. No rash noted. She is not diaphoretic. No erythema.  Psychiatric: She has a normal mood and affect. Her behavior is normal. Thought content normal.    Labs reviewed; notable for wbc 14.6, h/h 10-11/32-34, plt 332, na 141, K 3.3, bun/cr 28/1.2, glucose 235, lactate 2.0, abg 7.36/44/62, Trop <0.3, BNP >1000  Chest x-ray: pulmonary edema ECG reviewed from 11/24 NSR  Problem List Pulmonary edema  Acute heart failure - diastolic Shortness of breath Atrial fibrillation on anticoagulation T2DM with hyperglycemia Leukocytosis Anemia  Assessment/Plan 72 yo woman with PMH above here with acute pulmonary edema with recent atrial fibrillation (11/24) DCCV. Differential diagnosis includes atrial fibrillation, ischemia, diastolic heart failure, flash pulmonary edema related to hypertension among other etiologies. She has a CHADS2VASC of 4+ on apixaban with recent start of flecainide with plans for lexiscan myoview to evaluate for CAD once in NSR. She is already much improved. I think she needs another 1-2 doses of IV lasix and then gradually transition to oral lasix. Will need continue blood pressure control, rhythm/rate control as able and low salt diet with frequent activity to prevent future  heart failure recurrences. We discussed the likely etiology of her symptoms (HFpEF).  - assess response of NTG gtt and lasix - trend troponins  - will use BIPAP as necessary currently on [place] 4L - replete potassium - continue apixaban, plavix, aspirin - defer changes to primary team in AM  - continued flecainide 100 mg bid (start 10:00) - continuing home lisinopril 5 mg and toprol Xl 100 mg  - holding home metformin and glipizide - lasix 60 mg IV bid written for next dose 08:00  Pharrell Ledford 05/26/2013, 2:50 AM

## 2013-05-26 NOTE — Progress Notes (Addendum)
Subjective: CP is returning, Band-like, 3-4/10.  Seems to get worse as she takes a deep breath.    Objective: Vital signs in last 24 hours: Temp:  [97.6 F (36.4 C)-100.6 F (38.1 C)] 98.1 F (36.7 C) (11/26 0804) Pulse Rate:  [74-119] 119 (11/26 0804) Resp:  [18-44] 18 (11/26 0804) BP: (117-146)/(49-80) 146/80 mmHg (11/26 0804) SpO2:  [70 %-95 %] 95 % (11/26 0804) Weight:  [223 lb (101.152 kg)-225 lb (102.059 kg)] 225 lb (102.059 kg) (11/26 0420) Last BM Date: 05/26/13  Intake/Output from previous day: 11/25 0701 - 11/26 0700 In: -  Out: 1300 [Urine:1300] Intake/Output this shift:    Medications Current Facility-Administered Medications  Medication Dose Route Frequency Provider Last Rate Last Dose  . 0.9 %  sodium chloride infusion  250 mL Intravenous PRN Leeann Must, MD      . acetaminophen (TYLENOL) tablet 650 mg  650 mg Oral Q4H PRN Leeann Must, MD      . apixaban Everlene Balls) tablet 5 mg  5 mg Oral BID Leeann Must, MD      . aspirin EC tablet 81 mg  81 mg Oral Daily Leeann Must, MD      . atorvastatin (LIPITOR) tablet 20 mg  20 mg Oral q1800 Leeann Must, MD      . clopidogrel (PLAVIX) tablet 75 mg  75 mg Oral Q breakfast Leeann Must, MD      . diltiazem (CARDIZEM) 100 mg in dextrose 5 % 100 mL infusion  5-15 mg/hr Intravenous Titrated Wilburt Finlay, PA-C      . flecainide (TAMBOCOR) tablet 100 mg  100 mg Oral BID Leeann Must, MD      . furosemide (LASIX) injection 60 mg  60 mg Intravenous Q12H Leeann Must, MD      . lisinopril (PRINIVIL,ZESTRIL) tablet 5 mg  5 mg Oral Daily Leeann Must, MD      . metoprolol succinate (TOPROL-XL) 24 hr tablet 200 mg  200 mg Oral Daily Leeann Must, MD      . nitroGLYCERIN 0.2 mg/mL in dextrose 5 % infusion  5 mcg/min Intravenous Titrated Sunnie Nielsen, MD 1.5 mL/hr at 05/26/13 0210 5 mcg/min at 05/26/13 0210  . ondansetron (ZOFRAN) injection 4 mg  4 mg Intravenous Once Sunnie Nielsen, MD      . ondansetron [facility]) injection 4 mg  4 mg  Intravenous Q6H PRN Leeann Must, MD      . pantoprazole (PROTONIX) EC tablet 40 mg  40 mg Oral Daily Leeann Must, MD      . Melene Muller ON 05/27/2013] pneumococcal 23 valent vaccine (PNU-IMMUNE) injection 0.5 mL  0.5 mL Intramuscular Tomorrow-1000 Leeann Must, MD      . potassium chloride SA (K-DUR,KLOR-CON) CR tablet 20 mEq  20 mEq Oral BID Leeann Must, MD      . sodium chloride 0.9 % injection 3 mL  3 mL Intravenous Q12H Leeann Must, MD      . sodium chloride 0.9 % injection 3 mL  3 mL Intravenous PRN Leeann Must, MD        PE: General appearance: alert, cooperative and no distress Lungs: Basilar rales.  No wheezing or rhonchi. Heart: irregularly irregular rhythm and No MRG Abdomen: BS, Nontender Extremities: 1+ right LEE, Trace on the left Pulses: Radials 2+,  1+ left PT, 0 DP, 0 right PT/DP.  Feet are warm.  Skin: Warm and dry Neurologic: Grossly normal  Lab Results:   Recent Labs  05/26/13 0115 05/26/13 0134  WBC  14.6*  --   HGB 10.6* 11.9*  HCT 32.5* 35.0*  PLT 332  --    BMET  Recent Labs  05/24/13 1009 05/25/13 1043 05/26/13 0115 05/26/13 0134  NA 142 140 136 141  K 3.0* 3.0* 3.2* 3.3*  CL 98 101 94* 99  CO2 32 31 23  --   GLUCOSE 145* 136* 249* 235*  BUN 14 21 28* 28*  CREATININE 0.83 1.0 1.02 1.20*  CALCIUM 7.2* 7.2* 7.7*  --    PT/INR  Recent Labs  05/26/13 0800  LABPROT 15.8*  INR 1.29   Cardiac Panel (last 3 results)  Recent Labs  05/26/13 0115 05/26/13 0801  TROPONINI <0.30 <0.30   TTE 04/07/2013 Study Conclusions  - Left ventricle: The cavity size was normal. Wall thickness was increased in a pattern of mild LVH. The estimated ejection fraction was 60%. Regional wall motion abnormalities cannot be excluded. - Aortic valve: Sclerosis without stenosis. No significant regurgitation. - Mitral valve: Mild regurgitation. - Left atrium: The atrium was moderately dilated. - Pulmonary arteries: PA peak pressure: 31mm Hg  (S).   Assessment/Plan  Principal Problem:   Acute CHF Active Problems:   Atrial fibrillation   Hyperlipidemia   Diabetes mellitus without complication   Shortness of breath   Peripheral vascular disease, unspecified   Acute diastolic CHF (congestive heart failure)  Plan:  Failed DCCV recently(10/28).  But was in NSR on 11/24.   Flecainide increased to 100mg  bid(which makes her feel poorly) as by Dr. Johney Frame.   In Afib RVR now.   I added IV diltiazem earlier when paged and titrating.    Rate seems to be improving.  On Eliquis, ASA, Plavix.  K+ being replaced.   CP is returning(Typical and atypical features). Troponins negative.  Titrating IV NTG which appears to have helped.  Last nuc ~2005.  Recommend lexiscan when HR stabilized unless CP continues.  Acute CHF likely related to Afib RVR.  Recent echo with normal EF.  Net fluids: -1.3L.  Continue IV lasix today and reassess BNP in AM.    LOS: 0 days   HAGER, BRYAN 05/26/2013 28:44 AM   72 year old female with paroxysmal a-fib, s/p failed cardioversion on 11/24 who was admitted yesterday with chest pain and acute diastolic heart failure secondary to a-fib with RVR. -1300 ml negative fluid balance in the last 24 hours with improvement of SOB but still significant fluid overload. The patient was started on Diltiazem drip for rate control and is still mildly tachycardiac 100-110 range. Her chest pain is atypical, constant, with deep respirations, now subsided to 2/10. On iv NTG. Negative cardiac enzymes. The plan is to diurese and perform a stress test once she is optimized. We will decrease Lasix to 40 mg iv BID as crea is increasing. We will replace potassium.  Tobias Alexander, Rexene Edison 05/26/2013

## 2013-05-26 NOTE — Plan of Care (Signed)
Problem: Food- and Nutrition-Related Knowledge Deficit (NB-1.1) Goal: Nutrition education Formal process to instruct or train a patient/client in a skill or to impart knowledge to help patients/clients voluntarily manage or modify food choices and eating behavior to maintain or improve health. Outcome: Completed/Met Date Met:  05/26/13 Nutrition Education Note  RD consulted for nutrition education regarding new onset CHF.  Pt is currently living with her daughter and her daughter is doing the cooking. Family no available at this time.   RD provided "Low Sodium Nutrition Therapy" handout from the Academy of [employer]. Reviewed patient's dietary recall. Provided examples on ways to decrease sodium intake in diet. Discouraged intake of processed foods and use of salt shaker. Encouraged fresh fruits and vegetables as well as whole grain sources of carbohydrates to maximize fiber intake.   RD discussed why it is important for patient to adhere to diet recommendations, and emphasized the role of fluids, foods to avoid, and importance of weighing self daily. Teach back method used.  Expect fair compliance.  Body mass index is 39.87 kg/(m^2). Pt meets criteria for obesity class III based on current BMI.  Current diet order is Heart Healthy, patient is consuming approximately 100% of meals at this time. Labs and medications reviewed. No further nutrition interventions warranted at this time. RD contact information provided. If additional nutrition issues arise, please re-consult RD.   Kendell Bane RD, LDN, CNSC [phone] Pager [phone] After Hours Pager

## 2013-05-26 NOTE — ED Notes (Signed)
Having shortness of breath per pt. Was at [facility] the other day for cardioversion from A-Fib.

## 2013-05-26 NOTE — Progress Notes (Signed)
Utilization Review Completed.Sherry Vargas T11/26/2014

## 2013-05-27 DIAGNOSIS — E119 Type 2 diabetes mellitus without complications: Secondary | ICD-10-CM

## 2013-05-27 DIAGNOSIS — I739 Peripheral vascular disease, unspecified: Secondary | ICD-10-CM

## 2013-05-27 LAB — BASIC METABOLIC PANEL
BUN: 15 mg/dL (ref 6–23)
CO2: 27 mEq/L (ref 19–32)
Chloride: 96 mEq/L (ref 96–112)
Creatinine, Ser: 0.76 mg/dL (ref 0.50–1.10)
GFR calc non Af Amer: 83 mL/min — ABNORMAL LOW (ref >90)
Potassium: 3.4 mEq/L — ABNORMAL LOW (ref 3.5–5.1)
Sodium: 136 mEq/L (ref 135–145)

## 2013-05-27 LAB — GLUCOSE, CAPILLARY
Glucose-Capillary: 164 mg/dL — ABNORMAL HIGH (ref 70–99)
Glucose-Capillary: 244 mg/dL — ABNORMAL HIGH (ref 70–99)
Glucose-Capillary: 99 mg/dL (ref 70–99)

## 2013-05-27 MED ORDER — METFORMIN HCL 500 MG PO TABS
1000.0000 mg | ORAL_TABLET | Freq: Two times a day (BID) | ORAL | Status: DC
  Administered 2013-05-27 – 2013-06-02 (×12): 1000 mg via ORAL
  Filled 2013-05-27 (×15): qty 2

## 2013-05-27 MED ORDER — METOPROLOL TARTRATE 1 MG/ML IV SOLN
5.0000 mg | Freq: Once | INTRAVENOUS | Status: AC
  Administered 2013-05-27: 22:00:00 5 mg via INTRAVENOUS
  Filled 2013-05-27 (×2): qty 5

## 2013-05-27 MED ORDER — POTASSIUM CHLORIDE CRYS ER 20 MEQ PO TBCR
40.0000 meq | EXTENDED_RELEASE_TABLET | Freq: Two times a day (BID) | ORAL | Status: DC
  Administered 2013-05-27 – 2013-06-02 (×9): 40 meq via ORAL
  Filled 2013-05-27 (×13): qty 2

## 2013-05-27 MED ORDER — FUROSEMIDE 10 MG/ML IJ SOLN
60.0000 mg | Freq: Two times a day (BID) | INTRAMUSCULAR | Status: DC
  Administered 2013-05-27 – 2013-05-30 (×7): 60 mg via INTRAVENOUS
  Filled 2013-05-27 (×8): qty 6

## 2013-05-27 MED ORDER — GLIPIZIDE 2.5 MG HALF TABLET
2.5000 mg | ORAL_TABLET | Freq: Two times a day (BID) | ORAL | Status: DC
  Administered 2013-05-27 – 2013-06-02 (×12): 2.5 mg via ORAL
  Filled 2013-05-27 (×15): qty 1

## 2013-05-27 NOTE — Progress Notes (Signed)
Agree with Irving Burton RN BSN assessment and notes signing off

## 2013-05-27 NOTE — Progress Notes (Signed)
SUBJECTIVE:  [name] is well known to me.  She presents with acute SOB due to volume overload in the setting of recurrent/ refractory atrial fibrillation with RVR.  She has failed medical therapy with flecainide.  She has had chest tightness with RVR and volume overload.  She is gradually improving with diuresis.  She is appropriately anticoagulated with eliquis.   [name] apixaban  5 mg Oral BID  . atorvastatin  20 mg Oral q1800  . clopidogrel  75 mg Oral Q breakfast  . furosemide  60 mg Intravenous Q12H  . insulin aspart  0-5 Units Subcutaneous QHS  . insulin aspart  0-9 Units Subcutaneous TID WC  . lisinopril  5 mg Oral Daily  . metoprolol succinate  200 mg Oral Daily  . ondansetron  4 mg Intravenous Once  . pantoprazole  40 mg Oral Daily  . pneumococcal 23 valent vaccine  0.5 mL Intramuscular Tomorrow-1000  . potassium chloride SA  40 mEq Oral BID  . sodium chloride  3 mL Intravenous Q12H      OBJECTIVE: Physical Exam: Filed Vitals:   05/27/13 0742 05/27/13 0800 05/27/13 0803 05/27/13 0807  BP: 112/51 112/59    Pulse: 92 96    Temp: 98.9 F (37.2 C)     TempSrc: Oral     Resp: 18     Height:      Weight:      SpO2: 92% 93% 93% 94%    Intake/Output Summary (Last 24 hours) at 05/27/13 0951 Last data filed at 05/27/13 0857  Gross per 24 hour  Intake 1311.5 ml  Output   2445 ml  Net -1133.5 ml    Telemetry reveals afib with RVR  GEN- The patient is well appearing, alert and oriented x 3 today.   Head- normocephalic, atraumatic Eyes-  Sclera clear, conjunctiva pink Ears- hearing intact Oropharynx- clear Neck- supple, + JVD Lungs- moderate bibasilar rales 1/3 way up, normal work of breathing Heart- irregular rate and rhythm, no murmurs, rubs or gallops, PMI not laterally displaced GI- soft, NT, ND, + BS Extremities- no clubbing, cyanosis, + edema Skin- no rash or lesion Psych- euthymic mood, full affect Neuro- strength and sensation are intact  LABS: Basic  Metabolic Panel:  Recent Labs  16/10/96 0115 05/26/13 0134 05/27/13 0609  NA 136 141 136  K 3.2* 3.3* 3.4*  CL 94* 99 96  CO2 23  --  27  GLUCOSE 249* 235* 173*  BUN 28* 28* 15  CREATININE 1.02 1.20* 0.76  CALCIUM 7.7*  --  7.4*   Liver Function Tests:  Recent Labs  05/26/13 0115  AST 39*  ALT 32  ALKPHOS 103  BILITOT 0.5  PROT 7.2  ALBUMIN 3.5   No results found for this basename: LIPASE, AMYLASE,  in the last 72 hours CBC:  Recent Labs  05/26/13 0115 05/26/13 0134  WBC 14.6*  --   HGB 10.6* 11.9*  HCT 32.5* 35.0*  MCV 88.8  --   PLT 332  --    Cardiac Enzymes:  Recent Labs  05/26/13 0801 05/26/13 1100 05/26/13 1539  TROPONINI <0.30 <0.30 <0.30   RADIOLOGY: Dg Chest Portable 1 View  05/26/2013   CLINICAL DATA:  Shortness of breath and nausea tonight.  EXAM: PORTABLE CHEST - 1 VIEW  COMPARISON:  None.  FINDINGS: Mild cardiac enlargement with pulmonary vascular congestion. Interstitial changes suggest interstitial edema. No focal consolidation. No blunting of costophrenic angles. No pneumothorax.  IMPRESSION: Cardiac enlargement with  pulmonary vascular congestion and interstitial edema.   Electronically Signed   By: Burman Nieves M.D.   On: 05/26/2013 01:40    ASSESSMENT AND PLAN:  Principal Problem:   Acute CHF Active Problems:   Atrial fibrillation   Hyperlipidemia   Diabetes mellitus without complication   Shortness of breath   Peripheral vascular disease, unspecified   Acute diastolic CHF (congestive heart failure)  1. Acute diastolic CHF Likely exacerbated by afib with RVR We will continue IV diuresis while we pursue sinus rhythm Rate control with metoprolol  2. Chest discomfort Appears to be due to afib and CHF.  My suspicion for ischemic is low Stop IV nitro gtt Rate control afib and proceed with pursuit of sinus rhythm myoview once in sinus rhythm and more stable  3. Persistent afib This has become quite a challenge.  She has  failed medical therapy with flecainide. I will therefore stop flecainide at this time. Therapeutic strategies for afib including tikosyn and amiodarone were discussed in detail with the patient today. Risk, benefits, and alternatives to each medicine were also discussed in detail today.  She would prefer to try tikosyn. I will therefore plan to proceed with tikosyn initiation once her flecainide is adequately washed out.   I would anticipate that her first dose of tikosyn would be given Saturday evening. We will need to replete K and Mg before then.  I will rate control with metoprolol in the interim.  4. PVD Stable She is followed by Dr Darrick Penna now I will ask Dr Darrick Penna if he thinks that she will require long term antiplatelet therapy is the setting of her long term oral anticoagulation requirement.  5. DM Follow blood sugars Will restart metformin and glipizide and follow Diabetes teaching  6. PT Consult placed  OK for telemetry floor  Hillis Range, MD 05/27/2013 9:51 AM

## 2013-05-27 NOTE — Progress Notes (Signed)
Patient daughter brought copy of advanced directives and was placed into chart.

## 2013-05-27 NOTE — Progress Notes (Signed)
Pt calls frequently to use bsc, becomes sob with minimal activity.  O2 sat down to 88%, back up to 92% after couple minutes of rest.  BP 90/44, a-fib 90's, Cardizem decreased to 10 mg/hr.  Unable to sleep unless sitting up 45 degrees.  Tele afib 80's, BP 106/45.  Call light in reach.

## 2013-05-27 NOTE — Progress Notes (Signed)
Patient had episode of emesis.  PRN provided for anti-nausea.  Patient states nausea resolved with PRN.  Will continue to monitor.

## 2013-05-27 NOTE — Progress Notes (Signed)
Rec PT from 6c, O4x no complaints of CP or sob will continue to monitor

## 2013-05-27 NOTE — Progress Notes (Signed)
Pt having nausea and vomiting. Vomited up some of her lunch and med pills. Pt states no pain at this time. Only wants G-ale for nausea. will continue to monitor .

## 2013-05-28 LAB — GLUCOSE, CAPILLARY
Glucose-Capillary: 157 mg/dL — ABNORMAL HIGH (ref 70–99)
Glucose-Capillary: 163 mg/dL — ABNORMAL HIGH (ref 70–99)
Glucose-Capillary: 180 mg/dL — ABNORMAL HIGH (ref 70–99)

## 2013-05-28 LAB — BASIC METABOLIC PANEL
Chloride: 97 mEq/L (ref 96–112)
GFR calc Af Amer: 77 mL/min — ABNORMAL LOW (ref >90)
Potassium: 4 mEq/L (ref 3.5–5.1)
Sodium: 137 mEq/L (ref 135–145)

## 2013-05-28 MED ORDER — DIGOXIN 0.25 MG/ML IJ SOLN
0.2500 mg | INTRAMUSCULAR | Status: DC
  Filled 2013-05-28 (×2): qty 1

## 2013-05-28 MED ORDER — DIGOXIN 0.25 MG/ML IJ SOLN
0.2500 mg | Freq: Once | INTRAMUSCULAR | Status: AC
  Administered 2013-05-28: 20:00:00 0.25 mg via INTRAVENOUS
  Filled 2013-05-28: qty 1

## 2013-05-28 MED ORDER — METOPROLOL TARTRATE 1 MG/ML IV SOLN
2.5000 mg | INTRAVENOUS | Status: DC | PRN
  Administered 2013-05-28 – 2013-05-30 (×4): 2.5 mg via INTRAVENOUS
  Filled 2013-05-28 (×3): qty 5

## 2013-05-28 MED ORDER — LIVING WELL WITH DIABETES BOOK
Freq: Once | Status: AC
  Administered 2013-05-28: 11:00:00
  Filled 2013-05-28: qty 1

## 2013-05-28 NOTE — Progress Notes (Signed)
Spoke to patient regarding diabetes.  Her daughter Harvie Heck is very interested in obtaining a diet plan to better care for her mothers chronic health care needs at home.  She had been researching both heart healthy and diabetes meal plans.  A1C appears well controlled from September.  Will order outpatient diabetes education per protocol per patient request.

## 2013-05-28 NOTE — Progress Notes (Signed)
SUBJECTIVE: She states that she still has some chest tightness and shortness of breath, but this is improving. Otherwise, she denies other complaints this morning.  BMET pending this morning.   CURRENT MEDICATIONS: . apixaban  5 mg Oral BID  . atorvastatin  20 mg Oral q1800  . clopidogrel  75 mg Oral Q breakfast  . furosemide  60 mg Intravenous BID  . glipiZIDE  2.5 mg Oral BID AC  . insulin aspart  0-5 Units Subcutaneous QHS  . insulin aspart  0-9 Units Subcutaneous TID WC  . lisinopril  5 mg Oral Daily  . metFORMIN  1,000 mg Oral BID WC  . metoprolol succinate  200 mg Oral Daily  . ondansetron  4 mg Intravenous Once  . pantoprazole  40 mg Oral Daily  . pneumococcal 23 valent vaccine  0.5 mL Intramuscular Tomorrow-1000  . potassium chloride SA  40 mEq Oral BID  . sodium chloride  3 mL Intravenous Q12H      OBJECTIVE: Physical Exam: Filed Vitals:   05/27/13 1258 05/27/13 1406 05/27/13 2016 05/28/13 0507  BP: 104/60 96/60 105/66 102/63  Pulse: 99 115 108 118  Temp:  98.4 F (36.9 C) 98.1 F (36.7 C) 98.9 F (37.2 C)  TempSrc:  Oral Oral Oral  Resp:  18 19 19   Height:      Weight:    223 lb 6.4 oz (101.334 kg)  SpO2:  93% 98% 96%    Intake/Output Summary (Last 24 hours) at 05/28/13 0727 Last data filed at 05/28/13 0981  Gross per 24 hour  Intake   1183 ml  Output   1380 ml  Net   -197 ml    Telemetry reveals afib with RVR - ventricular rates 90-110  GEN- The patient is well appearing, alert and oriented x 3 today.   Head- normocephalic, atraumatic Eyes-  Sclera clear, conjunctiva pink Ears- hearing intact Oropharynx- clear Neck- supple, + JVD Lungs- moderate bibasilar rales 1/3 way up, normal work of breathing Heart- irregular rate and rhythm, no murmurs, rubs or gallops, PMI not laterally displaced GI- soft, NT, ND, + BS Extremities- no clubbing, cyanosis, + edema Skin- no rash or lesion Psych- euthymic mood, full affect Neuro- strength and sensation  are intact  LABS: Basic Metabolic Panel:  Recent Labs  19/14/78 0115 05/26/13 0134 05/27/13 0609  NA 136 141 136  K 3.2* 3.3* 3.4*  CL 94* 99 96  CO2 23  --  27  GLUCOSE 249* 235* 173*  BUN 28* 28* 15  CREATININE 1.02 1.20* 0.76  CALCIUM 7.7*  --  7.4*   Liver Function Tests:  Recent Labs  05/26/13 0115  AST 39*  ALT 32  ALKPHOS 103  BILITOT 0.5  PROT 7.2  ALBUMIN 3.5   CBC:  Recent Labs  05/26/13 0115 05/26/13 0134  WBC 14.6*  --   HGB 10.6* 11.9*  HCT 32.5* 35.0*  MCV 88.8  --   PLT 332  --    Cardiac Enzymes:  Recent Labs  05/26/13 0801 05/26/13 1100 05/26/13 1539  TROPONINI <0.30 <0.30 <0.30   RADIOLOGY: Dg Chest Portable 1 View 05/26/2013   CLINICAL DATA:  Shortness of breath and nausea tonight.  EXAM: PORTABLE CHEST - 1 VIEW  COMPARISON:  None.  FINDINGS: Mild cardiac enlargement with pulmonary vascular congestion. Interstitial changes suggest interstitial edema. No focal consolidation. No blunting of costophrenic angles. No pneumothorax.  IMPRESSION: Cardiac enlargement with pulmonary vascular congestion and interstitial edema.  Electronically Signed   By: Burman Nieves M.D.   On: 05/26/2013 01:40    ASSESSMENT AND PLAN:  Principal Problem:   Acute CHF Active Problems:   Atrial fibrillation   Hyperlipidemia   Diabetes mellitus without complication   Shortness of breath   Peripheral vascular disease, unspecified   Acute diastolic CHF (congestive heart failure)  1. Acute diastolic CHF Likely exacerbated by afib with RVR We will continue IV diuresis while we pursue sinus rhythm Rate control with metoprolol  2. Chest discomfort Appears to be due to afib and CHF.  My suspicion for ischemic is low Stop IV nitro gtt Rate control afib and proceed with pursuit of sinus rhythm myoview once in sinus rhythm and more stable  3. Persistent afib She has failed medical therapy with flecainide. Proceed with tikosyn initiation once her  flecainide is adequately washed out.   Her first dose of tikosyn would be given Saturday evening.  Though in afib her QT is difficult to assess, in sinus on prior ekgs her QT is routinely < 440 msec.  I would start tikosyn 500 mcg BID and follow. We will need to replete K and Mg before then.  I will rate control with metoprolol in the interim.  4. PVD Stable Stop plavix if ok with Dr Darrick Penna  5. DM Follow blood sugars Diabetes teaching  6. PT Consult placed

## 2013-05-28 NOTE — Progress Notes (Signed)
Patient had episode of nausea and scant emesis while eating lunch.  Refused anti-nausea PRN.  Will continue to monitor.

## 2013-05-28 NOTE — Evaluation (Signed)
Physical Therapy Evaluation Patient Details Name: Sherry Vargas MRN: [id number] DOB: 1941-03-14 Today's Date: 05/28/2013 Time: 4098-1191 PT Time Calculation (min): 20 min  PT Assessment / Plan / Recommendation History of Present Illness  Sherry Vargas is a 72 yo woman with PMH of dyslipidemia, T2DM, atrial fibrillation, peripheral vascular disease who came into [facility] with significant shortness of breath and found to have pulmonary edema.  At [facility] she was started on NTG gtt and lasix and transferred to [facility].  Patient with recent cardioversion - unsuccessful.  Meds unsuccessful.  Per MD note - to start new medication for Afib on Sat/Sun.  Clinical Impression  Patient presents with problems listed below.  Will benefit from acute PT to maximize independence prior to discharge home with daughter.  Do not anticipate any f/u PT needs at discharge.    PT Assessment  Patient needs continued PT services    Follow Up Recommendations  No PT follow up;Supervision/Assistance - 24 hour    Does the patient have the potential to tolerate intense rehabilitation      Barriers to Discharge        Equipment Recommendations  None recommended by PT    Recommendations for Other Services     Frequency Min 3X/week    Precautions / Restrictions Precautions Precautions: Fall Restrictions Weight Bearing Restrictions: No   Pertinent Vitals/Pain O2 sat decreased to 88% on room air with gait.  Reapplied O2 at 4 l/min and O2 sat at 98%. HR at 140 following ambulation - RN notified.      Mobility  Bed Mobility Bed Mobility: Supine to Sit;Sitting - Scoot to Edge of Bed;Sit to Supine Supine to Sit: 5: Supervision;With rails Sitting - Scoot to Edge of Bed: 6: Modified independent (Device/Increase time);With rail Sit to Supine: 5: Supervision;With rail Details for Bed Mobility Assistance: Supervision for safety only.  No cues needed. Transfers Transfers: Sit to Stand;Stand to Sit Sit to  Stand: 5: Supervision;From bed Stand to Sit: 5: Supervision;To bed Details for Transfer Assistance: Supervision for safety only. Ambulation/Gait Ambulation/Gait Assistance: 4: Min guard Ambulation Distance (Feet): 200 Feet Assistive device: None Ambulation/Gait Assistance Details: Patient with slight unsteadiness with gait, losing balance to both sides.  Reports her legs feel "weak". Gait Pattern: Step-through pattern;Decreased stride length Gait velocity: Decreased General Gait Details: Patient with dyspnea 2/4.  O2 sats dropped to 88% on room air. - RN notified.    Exercises     PT Diagnosis: Abnormality of gait;Generalized weakness  PT Problem List: Decreased strength;Decreased activity tolerance;Decreased balance;Decreased mobility;Cardiopulmonary status limiting activity PT Treatment Interventions: DME instruction;Gait training;Functional mobility training;Balance training;Patient/family education     PT Goals(Current goals can be found in the care plan section) Acute Rehab PT Goals Patient Stated Goal: To get stronger PT Goal Formulation: With patient Time For Goal Achievement: 06/04/13 Potential to Achieve Goals: Good  Visit Information  Last PT Received On: 05/28/13 Assistance Needed: +1 History of Present Illness: Sherry Vargas is a 72 yo woman with PMH of dyslipidemia, T2DM, atrial fibrillation, peripheral vascular disease who came into [facility] with significant shortness of breath and found to have pulmonary edema.  At [facility] she was started on NTG gtt and lasix and transferred to [facility].  Patient with recent cardioversion - unsuccessful.  Meds unsuccessful.  Per MD note - to start new medication for Afib on Sat/Sun.       Prior Functioning  Home Living Family/patient expects to be discharged to:: Private residence  Living Arrangements: Children Available Help at Discharge: Family;Available 24 hours/day (Daughter works from home) Type of Home: House Home  Access: Stairs to enter [employer] of Steps: 2 Entrance Stairs-Rails: None Home Layout: Two level;Able to live on main level with bedroom/bathroom Home Equipment: Gilmer Mor - single point;Shower seat - built in Prior Function Level of Independence: Independent;Independent with assistive device(s) (Uses cane only for long distances out of house) Communication Communication: No difficulties    Cognition  Cognition Arousal/Alertness: Awake/alert Behavior During Therapy: WFL for tasks assessed/performed Overall Cognitive Status: Within Functional Limits for tasks assessed    Extremity/Trunk Assessment Upper Extremity Assessment Upper Extremity Assessment: Overall WFL for tasks assessed Lower Extremity Assessment Lower Extremity Assessment: Generalized weakness Cervical / Trunk Assessment Cervical / Trunk Assessment: Normal   Balance    End of Session PT - End of Session Equipment Utilized During Treatment: Gait belt Activity Tolerance: Patient limited by fatigue Patient left: in bed;with call bell/phone within reach Nurse Communication: Mobility status (Decrease in O2 sat and HR increase to 140.)  GP     Vena Austria 05/28/2013, 3:31 PM Durenda Hurt. Renaldo Fiddler, [facility] Acute Rehab Services Pager [phone]

## 2013-05-28 NOTE — Progress Notes (Signed)
Inpatient Diabetes Program Recommendations  AACE/ADA: New Consensus Statement on Inpatient Glycemic Control (2013)  Target Ranges:  Prepandial:   less than 140 mg/dL      Peak postprandial:   less than 180 mg/dL (1-2 hours)      Critically ill patients:  140 - 180 mg/dL   Reason for Visit: Referral received.  CBG's high in the hospital.  Home diabetes PO meds resumed last PM.  A1C=6.5% in September.  May consider adding Novolog meal coverage 3 units tid with meals while in the hospital.    Thanks, Beryl Meager, RN, BC-ADM Inpatient Diabetes Coordinator Pager [phone]

## 2013-05-28 NOTE — Progress Notes (Signed)
Pt Hr 120-130s sustain, with 140s-150s nonsustained. NP page, NP order Lopressor 2.5mg  q4 PRN for HR>130. PRN given. Will continue to monitor

## 2013-05-28 NOTE — Progress Notes (Signed)
Agree with Emily Miliano RN BSN assessment and notes signing off   

## 2013-05-28 NOTE — Progress Notes (Signed)
Pt O4x, in bed eating. No complaints of CP or sob. Pt states lt neck pain, heat pad given. Will continue to monitor

## 2013-05-29 ENCOUNTER — Other Ambulatory Visit: Payer: Self-pay

## 2013-05-29 LAB — MAGNESIUM
Magnesium: 1.6 mg/dL (ref 1.5–2.5)
Magnesium: 1.8 mg/dL (ref 1.5–2.5)

## 2013-05-29 LAB — BASIC METABOLIC PANEL
BUN: 20 mg/dL (ref 6–23)
BUN: 19 mg/dL (ref 6–23)
CO2: 33 mEq/L — ABNORMAL HIGH (ref 19–32)
Calcium: 7.9 mg/dL — ABNORMAL LOW (ref 8.4–10.5)
Calcium: 8.4 mg/dL (ref 8.4–10.5)
Chloride: 94 mEq/L — ABNORMAL LOW (ref 96–112)
Creatinine, Ser: 0.84 mg/dL (ref 0.50–1.10)
Creatinine, Ser: 0.81 mg/dL (ref 0.50–1.10)
GFR calc non Af Amer: 68 mL/min — ABNORMAL LOW (ref >90)
Glucose, Bld: 149 mg/dL — ABNORMAL HIGH (ref 70–99)
Glucose, Bld: 117 mg/dL — ABNORMAL HIGH (ref 70–99)
Potassium: 4.4 mEq/L (ref 3.5–5.1)

## 2013-05-29 LAB — GLUCOSE, CAPILLARY
Glucose-Capillary: 114 mg/dL — ABNORMAL HIGH (ref 70–99)
Glucose-Capillary: 176 mg/dL — ABNORMAL HIGH (ref 70–99)

## 2013-05-29 MED ORDER — MAGNESIUM SULFATE 40 MG/ML IJ SOLN
2.0000 g | INTRAMUSCULAR | Status: AC
  Administered 2013-05-29: 18:00:00 2 g via INTRAVENOUS
  Filled 2013-05-29 (×2): qty 50

## 2013-05-29 MED ORDER — SODIUM CHLORIDE 0.9 % IV SOLN: 250.0000 mL | INTRAVENOUS | Status: DC | PRN

## 2013-05-29 MED ORDER — SODIUM CHLORIDE 0.9 % IJ SOLN: 3.0000 mL | INTRAMUSCULAR | Status: DC | PRN

## 2013-05-29 MED ORDER — SODIUM CHLORIDE 0.9 % IJ SOLN
3.0000 mL | Freq: Two times a day (BID) | INTRAMUSCULAR | Status: DC
  Administered 2013-05-29 – 2013-06-01 (×6): 3 mL via INTRAVENOUS

## 2013-05-29 MED ORDER — DIGOXIN 0.25 MG/ML IJ SOLN
0.2500 mg | Freq: Once | INTRAMUSCULAR | Status: AC
  Administered 2013-05-29: 0.25 mg via INTRAVENOUS
  Filled 2013-05-29: qty 1

## 2013-05-29 MED ORDER — DOFETILIDE 500 MCG PO CAPS: 500.0000 ug | ORAL_CAPSULE | Freq: Two times a day (BID) | ORAL | Status: DC

## 2013-05-29 MED ORDER — MAGNESIUM OXIDE 400 (241.3 MG) MG PO TABS
400.0000 mg | ORAL_TABLET | Freq: Two times a day (BID) | ORAL | Status: DC
  Administered 2013-05-29 – 2013-06-02 (×10): 400 mg via ORAL
  Filled 2013-05-29 (×11): qty 1

## 2013-05-29 MED ORDER — DOFETILIDE 500 MCG PO CAPS
500.0000 ug | ORAL_CAPSULE | Freq: Two times a day (BID) | ORAL | Status: DC
Start: 2013-05-29 — End: 2013-05-29
  Filled 2013-05-29 (×2): qty 1

## 2013-05-29 MED ORDER — DOFETILIDE 500 MCG PO CAPS
500.0000 ug | ORAL_CAPSULE | Freq: Two times a day (BID) | ORAL | Status: DC
  Administered 2013-05-29 – 2013-05-31 (×4): 500 ug via ORAL
  Filled 2013-05-29 (×5): qty 1

## 2013-05-29 NOTE — Progress Notes (Signed)
SUBJECTIVE:  Feels better but still SOB  OBJECTIVE:   Vitals:   Filed Vitals:   05/29/13 0035 05/29/13 0255 05/29/13 0457 05/29/13 0943  BP: 102/70 113/69 98/57 104/59  Pulse: 107 119 108   Temp: 98.5 F (36.9 C) 97.9 F (36.6 C) 97.9 F (36.6 C)   TempSrc: Oral Oral Oral   Resp: 18 17 17    Height:      Weight:   223 lb 12.3 oz (101.5 kg)   SpO2: 94% 96% 96%    I&O's:   Intake/Output Summary (Last 24 hours) at 05/29/13 1007 Last data filed at 05/29/13 0900  Gross per 24 hour  Intake    993 ml  Output   1375 ml  Net   -382 ml   TELEMETRY: Reviewed telemetry pt in afib at 110-115bpm     PHYSICAL EXAM General: Well developed, well nourished, in no acute distress Head: Eyes PERRLA, No xanthomas.   Normal cephalic and atramatic  Lungs:   Crackles at bases bilaterally Heart:   HRRR S1 S2 Pulses are 2+ & equal. Abdomen: Bowel sounds are positive, abdomen soft and non-tender without masses  Extremities:   1+ edema Neuro: Alert and oriented X 3. Psych:  Good affect, responds appropriately   LABS: Basic Metabolic Panel:  Recent Labs  16/10/96 0607 05/29/13 0408  NA 137 137  K 4.0 4.4  CL 97 95*  CO2 28 32  GLUCOSE 149* 149*  BUN 18 20  CREATININE 0.86 0.84  CALCIUM 7.7* 7.9*  MG  --  1.4*   Liver Function Tests: No results found for this basename: AST, ALT, ALKPHOS, BILITOT, PROT, ALBUMIN,  in the last 72 hours No results found for this basename: LIPASE, AMYLASE,  in the last 72 hours CBC: No results found for this basename: WBC, NEUTROABS, HGB, HCT, MCV, PLT,  in the last 72 hours Cardiac Enzymes:  Recent Labs  05/26/13 1100 05/26/13 1539  TROPONINI <0.30 <0.30   BNP: No components found with this basename: POCBNP,  D-Dimer: No results found for this basename: DDIMER,  in the last 72 hours Hemoglobin A1C: No results found for this basename: HGBA1C,  in the last 72 hours Fasting Lipid Panel: No results found for this basename: CHOL, HDL, LDLCALC,  TRIG, CHOLHDL, LDLDIRECT,  in the last 72 hours Thyroid Function Tests: No results found for this basename: TSH, T4TOTAL, FREET3, T3FREE, THYROIDAB,  in the last 72 hours Anemia Panel: No results found for this basename: VITAMINB12, FOLATE, FERRITIN, TIBC, IRON, RETICCTPCT,  in the last 72 hours Coag Panel:   Lab Results  Component Value Date   INR 1.29 05/26/2013    RADIOLOGY: Dg Chest Portable 1 View  05/26/2013   CLINICAL DATA:  Shortness of breath and nausea tonight.  EXAM: PORTABLE CHEST - 1 VIEW  COMPARISON:  None.  FINDINGS: Mild cardiac enlargement with pulmonary vascular congestion. Interstitial changes suggest interstitial edema. No focal consolidation. No blunting of costophrenic angles. No pneumothorax.  IMPRESSION: Cardiac enlargement with pulmonary vascular congestion and interstitial edema.   Electronically Signed   By: Burman Nieves M.D.   On: 05/26/2013 01:40   ASSESSMENT AND PLAN:  Principal Problem:  Acute CHF  Active Problems:  Atrial fibrillation  Hyperlipidemia  Diabetes mellitus without complication  Shortness of breath  Peripheral vascular disease, unspecified  Acute diastolic CHF (congestive heart failure)   1. Acute diastolic CHF  Likely exacerbated by afib with RVR  We will continue IV diuresis while we pursue  sinus rhythm  2. Chest discomfort - no reoccurence Appears to be due to afib and CHF. Suspicion for ischemia is low  Myoview once in sinus rhythm and more stable  3. Persistent afib still not rate controlled and BP soft today She has failed medical therapy with flecainide.  Continue anticoagulation with Eliquis Proceed with tikosyn initiation once her flecainide is adequately washed out.  Her first dose of tikosyn will be this evening. Though in afib her QT is difficult to assess, in sinus on prior ekgs her QT is routinely < 440 msec Will start tikosyn 500 mcg BID and follow.  We will need to replete Mg before then.  Start Magnesium Oxide and  recheck BMET this PM before first Tikosyn dose.   Her K is repleted Continue rate control with Metoprolol as BP tolerates.  Will stop Lisinopril for now due to borderline low BP.  Will give a dose of digoxin 0,25mg  IV to help with rate control. 4. PVD  Stable  Stop plavix if ok with Dr Darrick Penna  5. DM  Follow blood sugars  Diabetes teaching  6. PT  Consult placed     Quintella Reichert, MD  05/29/2013  10:07 AM

## 2013-05-30 LAB — BASIC METABOLIC PANEL
CO2: 28 mEq/L (ref 19–32)
Calcium: 8.6 mg/dL (ref 8.4–10.5)
Creatinine, Ser: 0.76 mg/dL (ref 0.50–1.10)
GFR calc Af Amer: >90 mL/min (ref >90)
Glucose, Bld: 191 mg/dL — ABNORMAL HIGH (ref 70–99)
Potassium: 4.8 mEq/L (ref 3.5–5.1)
Sodium: 132 mEq/L — ABNORMAL LOW (ref 135–145)

## 2013-05-30 LAB — GLUCOSE, CAPILLARY

## 2013-05-30 MED ORDER — DILTIAZEM HCL 60 MG PO TABS
60.0000 mg | ORAL_TABLET | Freq: Four times a day (QID) | ORAL | Status: DC | PRN
  Administered 2013-05-30 – 2013-06-01 (×2): 60 mg via ORAL
  Filled 2013-05-30 (×2): qty 1

## 2013-05-30 MED ORDER — BISACODYL 10 MG RE SUPP: 10.0000 mg | Freq: Every day | RECTAL | Status: DC | PRN

## 2013-05-30 MED ORDER — POLYETHYLENE GLYCOL 3350 17 G PO PACK
17.0000 g | PACK | Freq: Every day | ORAL | Status: DC | PRN
Start: 2013-05-30 — End: 2013-06-02
  Filled 2013-05-30: qty 1

## 2013-05-30 MED ORDER — METOPROLOL TARTRATE 1 MG/ML IV SOLN
2.5000 mg | INTRAVENOUS | Status: DC | PRN
  Administered 2013-05-30 – 2013-06-01 (×4): 5 mg via INTRAVENOUS
  Filled 2013-05-30 (×4): qty 5

## 2013-05-30 MED ORDER — DOCUSATE SODIUM 100 MG PO CAPS
100.0000 mg | ORAL_CAPSULE | Freq: Two times a day (BID) | ORAL | Status: DC
  Administered 2013-05-30 – 2013-06-02 (×6): 100 mg via ORAL
  Filled 2013-05-30 (×8): qty 1

## 2013-05-30 NOTE — Progress Notes (Signed)
Pt remains in afib Will anticipate the need for cardioversion tomorrow I have spoken with anesthesia who will schedule her for 7:30 am cardioversion tomorrow.  In the interim, continue tikosyn 500 mcg BID Add cardizem 60mg  PO Q6 hours as needed for HR sustained over 120 bpm. I would not favor more aggressive rate control at this time unless she becomes more symptomatic.  Hillis Range MD

## 2013-05-30 NOTE — Progress Notes (Signed)
Pts HR 140-170s a-fib . R Barrett PA aware earlier. Metoprolol 2.5 mg IV given at 1100 with little change in HR. HR remained in150-160s. Bp stable the patient voices no complaints. Metoprolol 5 mg Iv given again at 13:30 for continued HR in 160s.. Dr Johney Frame called at about 15:45 to inform  me of plan to cardiovert in am. Informed of pt's continued a-fib in 160's. Pt made aware of am cardioversion. Consent obtained

## 2013-05-30 NOTE — Progress Notes (Signed)
Patient Name: Sherry Vargas Date of Encounter: 05/30/2013  Principal Problem:   Acute CHF Active Problems:   Atrial fibrillation   Hyperlipidemia   Diabetes mellitus without complication   Shortness of breath   Peripheral vascular disease, unspecified   Acute diastolic CHF (congestive heart failure)    SUBJECTIVE: Breathing better, no chest pain. Problems with vomiting, x 1 after K+, not after Tikosyn. Constipated.  OBJECTIVE Filed Vitals:   05/29/13 0943 05/29/13 1500 05/29/13 2033 05/30/13 0619  BP: 104/59 98/54 109/52 111/63  Pulse:  120 138 131  Temp:  97.7 F (36.5 C) 98.2 F (36.8 C) 98.5 F (36.9 C)  TempSrc:  Oral Oral Oral  Resp:  18 18 18   Height:      Weight:    221 lb 12.5 oz (100.6 kg)  SpO2:  90% 91% 90%    Intake/Output Summary (Last 24 hours) at 05/30/13 0807 Last data filed at 05/30/13 0645  Gross per 24 hour  Intake    560 ml  Output   2101 ml  Net  -1541 ml   Filed Weights   05/28/13 0507 05/29/13 0457 05/30/13 0619  Weight: 223 lb 6.4 oz (101.334 kg) 223 lb 12.3 oz (101.5 kg) 221 lb 12.5 oz (100.6 kg)    PHYSICAL EXAM General: Well developed, well nourished, female in no acute distress. Head: Normocephalic, atraumatic.  Neck: Supple without bruits, JVD 10 cm Lungs:  Resp regular and unlabored, decreased BS bases with rales. Heart: irreg, rapid at times, S1, S2, no S3, S4, or murmur; no rub. Abdomen: Soft, non-tender, non-distended, BS + x 4.  Extremities: No clubbing, cyanosis, no edema.  Neuro: Alert and oriented X 3. Moves all extremities spontaneously. Psych: Normal affect.  LABS: Basic Metabolic Panel:  Recent Labs  11/91/47 0408 05/29/13 1535 05/29/13 1846 05/29/13 2040  NA 137 135  --   --   K 4.4 4.8  --   --   CL 95* 94*  --   --   CO2 32 33*  --   --   GLUCOSE 149* 117*  --   --   BUN 20 19  --   --   CREATININE 0.84 0.81  --   --   CALCIUM 7.9* 8.4  --   --   MG 1.4* 1.4* 1.6 1.8   BNP: Pro B  Natriuretic peptide (BNP)  Date/Time Value Range Status  05/26/2013  3:50 PM 5445.0* 0 - 125 pg/mL Final  05/26/2013  1:15 AM 1097.0* 0 - 125 pg/mL Final    TELE: atrial fib, RVR at times      ECG: will be 2 h after Tikosyn  Radiology/Studies: Dg Chest Portable 1 View 05/26/2013   CLINICAL DATA:  Shortness of breath and nausea tonight.  EXAM: PORTABLE CHEST - 1 VIEW  COMPARISON:  None.  FINDINGS: Mild cardiac enlargement with pulmonary vascular congestion. Interstitial changes suggest interstitial edema. No focal consolidation. No blunting of costophrenic angles. No pneumothorax.  IMPRESSION: Cardiac enlargement with pulmonary vascular congestion and interstitial edema.   Electronically Signed   By: Burman Nieves M.D.   On: 05/26/2013 01:40     Current Medications:  . apixaban  5 mg Oral BID  . atorvastatin  20 mg Oral q1800  . clopidogrel  75 mg Oral Q breakfast  . dofetilide  500 mcg Oral Q12H  . furosemide  60 mg Intravenous BID  . glipiZIDE  2.5 mg Oral BID AC  .  insulin aspart  0-5 Units Subcutaneous QHS  . insulin aspart  0-9 Units Subcutaneous TID WC  . magnesium oxide  400 mg Oral BID  . metFORMIN  1,000 mg Oral BID WC  . metoprolol succinate  200 mg Oral Daily  . ondansetron  4 mg Intravenous Once  . pantoprazole  40 mg Oral Daily  . potassium chloride SA  40 mEq Oral BID  . sodium chloride  3 mL Intravenous Q12H  . sodium chloride  3 mL Intravenous Q12H      ASSESSMENT AND PLAN: Principal Problem:   Acute CHF Active Problems:   Atrial fibrillation   Hyperlipidemia   Diabetes mellitus without complication   Shortness of breath   Peripheral vascular disease, unspecified   Acute diastolic CHF (congestive heart failure) 1. Acute diastolic CHF  Likely exacerbated by afib with RVR  Improved volume, we will continue IV diuresis while we pursue sinus rhythm - possible change to PO in am  2. Chest discomfort - no reoccurence  Appears to be due to afib and CHF.  Suspicion for ischemia is low  Myoview once in sinus rhythm and more stable   3. Persistent afib still not rate controlled and BP soft   She has failed medical therapy with flecainide - last dose Weds.  Continue anticoagulation with Eliquis  Proceed with tikosyn initiation Her first dose of tikosyn was 11/29 pm. Though in afib her QT is difficult to assess, in sinus on prior ekgs her QT is routinely < 440 msec Tikosyn 500 mcg BID and follow.  We will need to replete Mg before then. Start Magnesium Oxide and recheck BMET this PM before first Tikosyn dose. Her K is repleted BMET 11/30 pending, recheck Mg in am. Continue rate control with Metoprolol as BP tolerates. Will stop Lisinopril for now due to borderline low BP. Will give a dose of digoxin 0,25mg  IV to help with rate control.   4. PVD  Stable  Stop plavix if ok with Dr Darrick Penna  - will call [facility]  5. DM  Follow blood sugars  Diabetes teaching   6. PT  Consult placed   [employer]

## 2013-05-31 ENCOUNTER — Encounter (HOSPITAL_COMMUNITY): Payer: Self-pay | Admitting: Certified Registered Nurse Anesthetist

## 2013-05-31 ENCOUNTER — Encounter (HOSPITAL_COMMUNITY): Payer: Medicare Other | Admitting: Certified Registered Nurse Anesthetist

## 2013-05-31 ENCOUNTER — Encounter (HOSPITAL_COMMUNITY)
Admission: EM | Disposition: A | Discharge status: Home Health | Payer: Self-pay | Source: Home / Self Care | Attending: Cardiology

## 2013-05-31 ENCOUNTER — Inpatient Hospital Stay (HOSPITAL_COMMUNITY): Payer: Medicare Other | Admitting: Certified Registered Nurse Anesthetist

## 2013-05-31 ENCOUNTER — Other Ambulatory Visit: Payer: Medicare Other

## 2013-05-31 HISTORY — PX: CARDIOVERSION: SHX1299

## 2013-05-31 LAB — GLUCOSE, CAPILLARY
Glucose-Capillary: 144 mg/dL — ABNORMAL HIGH (ref 70–99)
Glucose-Capillary: 184 mg/dL — ABNORMAL HIGH (ref 70–99)

## 2013-05-31 LAB — BASIC METABOLIC PANEL
CO2: 31 mEq/L (ref 19–32)
Chloride: 98 mEq/L (ref 96–112)
Creatinine, Ser: 0.75 mg/dL (ref 0.50–1.10)
GFR calc Af Amer: >90 mL/min (ref >90)
GFR calc non Af Amer: 83 mL/min — ABNORMAL LOW (ref >90)
Potassium: 4.7 mEq/L (ref 3.5–5.1)
Sodium: 138 mEq/L (ref 135–145)

## 2013-05-31 SURGERY — CARDIOVERSION
Anesthesia: General

## 2013-05-31 MED ORDER — DOFETILIDE 500 MCG PO CAPS
500.0000 ug | ORAL_CAPSULE | Freq: Two times a day (BID) | ORAL | Status: DC
  Administered 2013-05-31: 22:00:00 500 ug via ORAL
  Filled 2013-05-31 (×3): qty 1

## 2013-05-31 MED ORDER — FUROSEMIDE 40 MG PO TABS
40.0000 mg | ORAL_TABLET | Freq: Two times a day (BID) | ORAL | Status: DC
  Administered 2013-05-31 – 2013-06-02 (×5): 40 mg via ORAL
  Filled 2013-05-31 (×7): qty 1

## 2013-05-31 MED ORDER — LIDOCAINE HCL (CARDIAC) 20 MG/ML IV SOLN
INTRAVENOUS | Status: DC | PRN
  Administered 2013-05-31: 40 mg via INTRAVENOUS

## 2013-05-31 MED ORDER — SODIUM CHLORIDE 0.9 % IV SOLN
INTRAVENOUS | Status: DC | PRN
  Administered 2013-05-31: 08:00:00 via INTRAVENOUS

## 2013-05-31 MED ORDER — PROPOFOL 10 MG/ML IV BOLUS
INTRAVENOUS | Status: DC | PRN
  Administered 2013-05-31: 60 mg via INTRAVENOUS

## 2013-05-31 NOTE — Progress Notes (Signed)
PT Cancellation Note  Patient Details Name: MAIDA WIDGER MRN: [id number] DOB: 1940/08/01   Cancelled Treatment:    Reason Eval/Treat Not Completed: Patient at procedure or test/unavailable   Zafir Schauer 05/31/2013, 8:44 AM

## 2013-05-31 NOTE — Progress Notes (Signed)
Pt vomitted  after 10am meds. Tiksyn pill can pull, pt reswld . Zofran given. will continue to monitor

## 2013-05-31 NOTE — Preoperative (Signed)
Beta Blockers   Reason not to administer Beta Blockers:Not Applicable, took 11/30 at 1000

## 2013-05-31 NOTE — Progress Notes (Signed)
Pt still Afib with RVR, MD aware Lopressor 5 mg IV PRN given. HR on 107- 117 after this dose. Pt still on Tikosyn as ordered. EKG completed after 2 hours with QTc < 500. Pt encouraged to keep NPO after MN for possible cardiovertion in am. Will continue with POC.

## 2013-05-31 NOTE — Op Note (Signed)
EP procedure Note   Pre procedure Diagnosis:  Persistent Atrial fibrillation Post procedure Diagnosis:  Same  Procedures:  Electrical cardioversion  Description:  Informed, written consent was obtained for cardioversion.  Adequate IV acces and airway support were assured.  The patient was adequately sedated with intravenous propofol as outlined in the anesthesia report.  The patient presented today in atrial fibrillation.  She was successfully cardioverted to sinus rhythm with a single synchronized biphasic 200J shock delivered with cardioversion electrodes placed in the anterior/posterior configuration.  She remains in sinus rhythm thereafter.  There were no early apparent complications.  Conclusions:  1.  Successful cardioversion of afib to sinus rhythm 2.  No early apparent complications.   Ciaran Begay,MD 8:25 AM 05/31/2013

## 2013-05-31 NOTE — Anesthesia Preprocedure Evaluation (Addendum)
Anesthesia Evaluation  Patient identified by MRN, date of birth, ID band Patient awake    Reviewed: Allergy & Precautions, H&P , NPO status , Patient's Chart, lab work & pertinent test results, reviewed documented beta blocker date and time   Airway Mallampati: II TM Distance: >3 FB Neck ROM: Full    Dental   Pulmonary shortness of breath, former smoker,  breath sounds clear to auscultation        Cardiovascular hypertension, Pt. on medications and Pt. on home beta blockers + Peripheral Vascular Disease and +CHF + dysrhythmias Atrial Fibrillation Rhythm:Irregular Rate:Tachycardia     Neuro/Psych    GI/Hepatic   Endo/Other  diabetes  Renal/GU      Musculoskeletal   Abdominal   Peds  Hematology   Anesthesia Other Findings   Reproductive/Obstetrics                         Anesthesia Physical Anesthesia Plan  ASA: III  Anesthesia Plan: General   Post-op Pain Management:    Induction: Intravenous  Airway Management Planned: Mask  Additional Equipment:   Intra-op Plan:   Post-operative Plan:   Informed Consent: I have reviewed the patients History and Physical, chart, labs and discussed the procedure including the risks, benefits and alternatives for the proposed anesthesia with the patient or authorized representative who has indicated his/her understanding and acceptance.   Dental advisory given  Plan Discussed with: Anesthesiologist and Surgeon  Anesthesia Plan Comments:        Anesthesia Quick Evaluation

## 2013-05-31 NOTE — Anesthesia Postprocedure Evaluation (Signed)
  Anesthesia Post-op Note  Patient: Sherry Vargas  Procedure(s) Performed: Procedure(s): CARDIOVERSION (N/A)  Patient Location: Nursing Unit  Anesthesia Type:General  Level of Consciousness: awake, alert , oriented, patient cooperative and responds to stimulation  Airway and Oxygen Therapy: Patient Spontanous Breathing and Patient connected to nasal cannula oxygen  Post-op Pain: none  Post-op Assessment: Post-op Vital signs reviewed, Patient's Cardiovascular Status Stable and Respiratory Function Stable  Post-op Vital Signs: Reviewed and stable  Complications: No apparent anesthesia complications

## 2013-05-31 NOTE — Progress Notes (Signed)
Patient: Sherry Vargas Date of Encounter: 05/31/2013, 7:32 AM Admit date: 05/26/2013     Subjective  Ms. Sherry Vargas denies any new complaints. She denies CP or SOB.   Objective  Physical Exam: Vitals: BP 108/72  Pulse 61  Temp(Src) 97.7 F (36.5 C) (Oral)  Resp 20  Ht 5\' 3"  (1.6 m)  Wt 219 lb 12.8 oz (99.7 kg)  BMI 38.95 kg/m2  SpO2 96% General: Well developed, well appearing 72 year old female in no acute distress. Neck: Supple. JVD not elevated. Lungs: Clear bilaterally to auscultation without wheezes, rales, or rhonchi. Breathing is unlabored. Heart: Irregular, tachycardic S1 S2 without murmurs, rubs, or gallops.  Abdomen: Soft, non-distended. Extremities: No clubbing or cyanosis. No edema.  Distal pedal pulses are 2+ and equal bilaterally. Neuro: Alert and oriented X 3. Moves all extremities spontaneously. No focal deficits.  Intake/Output:  Intake/Output Summary (Last 24 hours) at 05/31/13 0732 Last data filed at 05/30/13 2100  Gross per 24 hour  Intake    580 ml  Output   2025 ml  Net  -1445 ml    Inpatient Medications:  . apixaban  5 mg Oral BID  . atorvastatin  20 mg Oral q1800  . clopidogrel  75 mg Oral Q breakfast  . docusate sodium  100 mg Oral BID  . dofetilide  500 mcg Oral Q12H  . furosemide  60 mg Intravenous BID  . glipiZIDE  2.5 mg Oral BID AC  . insulin aspart  0-5 Units Subcutaneous QHS  . insulin aspart  0-9 Units Subcutaneous TID WC  . magnesium oxide  400 mg Oral BID  . metFORMIN  1,000 mg Oral BID WC  . metoprolol succinate  200 mg Oral Daily  . ondansetron  4 mg Intravenous Once  . pantoprazole  40 mg Oral Daily  . potassium chloride SA  40 mEq Oral BID  . sodium chloride  3 mL Intravenous Q12H  . sodium chloride  3 mL Intravenous Q12H    Labs:  Recent Labs  05/29/13 1535 05/29/13 1846 05/29/13 2040 05/30/13 0610  NA 135  --   --  132*  K 4.8  --   --  4.8  CL 94*  --   --  92*  CO2 33*  --   --  28  GLUCOSE 117*  --   --   191*  BUN 19  --   --  17  CREATININE 0.81  --   --  0.76  CALCIUM 8.4  --   --  8.6  MG 1.4* 1.6 1.8  --     Radiology/Studies: Dg Chest Portable 1 View 05/26/2013    IMPRESSION: Cardiac enlargement with pulmonary vascular congestion and interstitial edema.    Electronically Signed   By: Burman Nieves M.D.   On: 05/26/2013 01:40   12-lead ECG: pending this AM; last night shows stable QTc 448 msec Telemetry: persistent AF w/RVR   Assessment and Plan  1. Persistent AF - now on Tikosyn; 12-lead ECG, BMET and Mg pending this AM  - scheduled for DCCV this AM with Dr. Johney Frame 2. Acute diastolic HF - improved with diuresis; consider changing to PO today  Dr. Johney Frame to see Signed, EDMISTEN, BROOKE PA-C  I have seen, examined the patient, and reviewed the above assessment and plan.  Changes to above are made where necessary.  Risks, benefits, and alternatives to cardioversion were discussed at length with the patient who wishes to proceed.  Will assess QT in sinus.  She appears much more euvolemic.  We will therefore convert lasix to PO today. Hopefully home tomorrow after 6th dose of tikosyn.  Co Sign: Hillis Range, MD 05/31/2013 7:52 AM

## 2013-05-31 NOTE — Transfer of Care (Signed)
Immediate Anesthesia Transfer of Care Note  Patient: Sherry Vargas  Procedure(s) Performed: Procedure(s): CARDIOVERSION (N/A)  Patient Location: Nursing Unit  Anesthesia Type:General  Level of Consciousness: awake, alert , oriented, patient cooperative and responds to stimulation  Airway & Oxygen Therapy: Patient Spontanous Breathing and Patient connected to nasal cannula oxygen  Post-op Assessment: Report given to PACU RN and Post -op Vital signs reviewed and stable  Post vital signs: Reviewed and stable  Complications: No apparent anesthesia complications

## 2013-05-31 NOTE — Anesthesia Postprocedure Evaluation (Signed)
  Anesthesia Post-op Note  Patient: Sherry Vargas  Procedure(s) Performed: Procedure(s): CARDIOVERSION (N/A)  Patient Location: 3E   Anesthesia Type:General  Level of Consciousness: awake, alert  and oriented  Airway and Oxygen Therapy: Patient Spontanous Breathing and Patient connected to nasal cannula oxygen  Post-op Pain: none  Post-op Assessment: Post-op Vital signs reviewed, Patient's Cardiovascular Status Stable, Respiratory Function Stable and Patent Airway  Post-op Vital Signs: stable  Complications: No apparent anesthesia complications

## 2013-05-31 NOTE — Progress Notes (Signed)
Patient fully awake.  Tolerating sips of liquid.  Breakfast ordered.

## 2013-06-01 DIAGNOSIS — Z8673 Personal history of transient ischemic attack (TIA), and cerebral infarction without residual deficits: Secondary | ICD-10-CM

## 2013-06-01 LAB — BASIC METABOLIC PANEL
BUN: 14 mg/dL (ref 6–23)
Calcium: 9.5 mg/dL (ref 8.4–10.5)
Creatinine, Ser: 0.89 mg/dL (ref 0.50–1.10)
GFR calc Af Amer: 74 mL/min — ABNORMAL LOW (ref >90)
GFR calc non Af Amer: 64 mL/min — ABNORMAL LOW (ref >90)
Glucose, Bld: 152 mg/dL — ABNORMAL HIGH (ref 70–99)
Potassium: 4.9 mEq/L (ref 3.5–5.1)

## 2013-06-01 LAB — T4, FREE: Free T4: 1.77 ng/dL (ref 0.80–1.80)

## 2013-06-01 LAB — HEPATIC FUNCTION PANEL
Albumin: 2.5 g/dL — ABNORMAL LOW (ref 3.5–5.2)
Alkaline Phosphatase: 137 U/L — ABNORMAL HIGH (ref 39–117)
Total Protein: 6.6 g/dL (ref 6.0–8.3)

## 2013-06-01 LAB — GLUCOSE, CAPILLARY
Glucose-Capillary: 145 mg/dL — ABNORMAL HIGH (ref 70–99)
Glucose-Capillary: 135 mg/dL — ABNORMAL HIGH (ref 70–99)
Glucose-Capillary: 160 mg/dL — ABNORMAL HIGH (ref 70–99)

## 2013-06-01 LAB — MAGNESIUM: Magnesium: 1.8 mg/dL (ref 1.5–2.5)

## 2013-06-01 LAB — TSH: TSH: 0.892 u[IU]/mL (ref 0.350–4.500)

## 2013-06-01 MED ORDER — PROMETHAZINE HCL 25 MG PO TABS
25.0000 mg | ORAL_TABLET | Freq: Three times a day (TID) | ORAL | Status: DC
  Administered 2013-06-01 – 2013-06-02 (×2): 25 mg via ORAL
  Filled 2013-06-01 (×4): qty 1

## 2013-06-01 MED ORDER — DILTIAZEM HCL ER COATED BEADS 120 MG PO CP24
120.0000 mg | ORAL_CAPSULE | Freq: Every day | ORAL | Status: DC
  Administered 2013-06-01 – 2013-06-02 (×2): 120 mg via ORAL
  Filled 2013-06-01 (×2): qty 1

## 2013-06-01 MED ORDER — FUROSEMIDE 40 MG PO TABS: 40.0000 mg | ORAL_TABLET | Freq: Two times a day (BID) | ORAL | Status: DC

## 2013-06-01 MED ORDER — AMIODARONE HCL 400 MG PO TABS: 400.0000 mg | ORAL_TABLET | Freq: Three times a day (TID) | ORAL | Status: DC

## 2013-06-01 NOTE — Progress Notes (Signed)
SUBJECTIVE: The patient is disappointed that she has reverted back to atrial fibrillation.  At this time, she denies chest pain, shortness of breath, or any new concerns.  CURRENT MEDICATIONS: . apixaban  5 mg Oral BID  . atorvastatin  20 mg Oral q1800  . clopidogrel  75 mg Oral Q breakfast  . docusate sodium  100 mg Oral BID  . dofetilide  500 mcg Oral Q12H  . furosemide  40 mg Oral BID  . glipiZIDE  2.5 mg Oral BID AC  . insulin aspart  0-5 Units Subcutaneous QHS  . insulin aspart  0-9 Units Subcutaneous TID WC  . magnesium oxide  400 mg Oral BID  . metFORMIN  1,000 mg Oral BID WC  . metoprolol succinate  200 mg Oral Daily  . ondansetron  4 mg Intravenous Once  . pantoprazole  40 mg Oral Daily  . potassium chloride SA  40 mEq Oral BID  . sodium chloride  3 mL Intravenous Q12H  . sodium chloride  3 mL Intravenous Q12H      OBJECTIVE: Physical Exam: Filed Vitals:   05/31/13 2042 05/31/13 2205 06/01/13 0430 06/01/13 0500  BP: 97/43 111/50 127/70   Pulse: 73 72 92 92  Temp: 97.3 F (36.3 C)  98 F (36.7 C) 98 F (36.7 C)  TempSrc: Oral  Oral Oral  Resp: 20 20 18 18   Height:      Weight:   218 lb (98.884 kg)   SpO2: 95% 95% 95%     Intake/Output Summary (Last 24 hours) at 06/01/13 0706 Last data filed at 06/01/13 0144  Gross per 24 hour  Intake   1400 ml  Output   1550 ml  Net   -150 ml    Telemetry reveals sinus rhythm yesterday with reversion to atrial fibrillation with RVR this morning at 4AM - V rates around 100  GEN- The patient is well appearing, alert and oriented x 3 today.   Head- normocephalic, atraumatic Eyes-  Sclera clear, conjunctiva pink Ears- hearing intact Oropharynx- clear Neck- supple, no JVP Lymph- no cervical lymphadenopathy Lungs- Clear to ausculation bilaterally, normal work of breathing Heart- irregular rate and rhythm, no murmurs, rubs or gallops, PMI not laterally displaced GI- soft, NT, ND, + BS Extremities- no clubbing,  cyanosis, or edema Skin- no rash or lesion Psych- euthymic mood, full affect Neuro- strength and sensation are intact  LABS: Basic Metabolic Panel:  Recent Labs  16/10/96 0715 06/01/13 0551  NA 138 134*  K 4.7 4.9  CL 98 94*  CO2 31 30  GLUCOSE 142* 152*  BUN 15 14  CREATININE 0.75 0.89  CALCIUM 8.9 9.5  MG 1.8 1.8    RADIOLOGY: Dg Chest Portable 1 View 05/26/2013   CLINICAL DATA:  Shortness of breath and nausea tonight.  EXAM: PORTABLE CHEST - 1 VIEW  COMPARISON:  None.  FINDINGS: Mild cardiac enlargement with pulmonary vascular congestion. Interstitial changes suggest interstitial edema. No focal consolidation. No blunting of costophrenic angles. No pneumothorax.  IMPRESSION: Cardiac enlargement with pulmonary vascular congestion and interstitial edema.   Electronically Signed   By: Burman Nieves M.D.   On: 05/26/2013 01:40    ASSESSMENT AND PLAN:  Principal Problem:   Acute CHF Active Problems:   Atrial fibrillation   Hyperlipidemia   Diabetes mellitus without complication   Shortness of breath   Peripheral vascular disease, unspecified   Acute diastolic CHF (congestive heart failure)  1. Acute diastolic CHF  Likely exacerbated  by afib with RVR  Rate control with metoprolol  Continue lasix 40mg  BID  2. Chest discomfort  Appears to be due to afib and CHF. My suspicion for ischemic is low  Rate control afib and proceed with pursuit of sinus rhythm  myoview once in sinus rhythm and more stable   3. Persistent afib  She has failed medical therapy with flecainide and Tikosyn I will stop tikosyn and start amiodarone 400mg  TID starting tomorrow am (none today). Check LFTs/TFTs I will rate control with metoprolol in the interim.  Add diltiazem 120CD daily Continue eliquis  4. PVD  Stable  Stop plavix as per my discussion with Dr Darrick Penna  DC to home today Follow-up with me as scheduled next week

## 2013-06-01 NOTE — Progress Notes (Signed)
Seen and agree with SPT note Kaelum Kissick Tabor Janaiah Vetrano, PT [phone]  

## 2013-06-01 NOTE — Consult Note (Signed)
Reason for Consult: Nausea and vomiting Referring Physician: Hillis Range, M.D.  Nyoka Lint HPI: This is a 72 year old female who was transferred from [facility] for atrial fibrillation.  She was recently cardioverted at [facility] on 05/24/2013, but she did not remain in sinus rhythm.  The patient started to have issues with SOB and she was noted to be in mild CHF.  Ultimately she was transferred to [facility] for further evaluation and treatment.  Cardioversion was performed again, but she did not remain in sinus rhythm.  She reports having issues with afib since August.  She is not able to discern if her vomiting is as a result of her afib.  The vomiting is rather random and she does not know what precipitates her symptoms.  No clear correlation with medications or any particular foods.  She underwent an EGD/Colonoscopy this past April for iron deficiency anemia, but no findings were identified.  No complaints of any abdominal pain.  Past Medical History  Diagnosis Date  . HLD (hyperlipidemia)   . DM (dermatomyositis)   . PAD (peripheral artery disease)   . 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 Paroxysmal a-fib     post operative following fem bypass at [facility] 12/11/10  . Hypertension   . Diabetes mellitus without complication   . Stroke     Past Surgical History  Procedure Laterality Date  . Adenoidectomy    . Breast lumpectomy      benign  . Knee cartilage surgery    . Intraoperative arteriogram      for claudication in L leg 2004 at [facility]  . Endovascular stents      R leg at [facility]  . Femoral bypass  12/11/10    R leg at [facility]  . Cardioversion N/A 04/27/2013    Procedure: CARDIOVERSION;  Surgeon: Wendall Stade, MD;  Location: [facility] ENDOSCOPY;  Service: Cardiovascular;  Laterality: N/A;  . Cardioversion N/A 05/24/2013    Procedure: CARDIOVERSION;  Surgeon: Pricilla Riffle, MD;  Location: [facility] ENDOSCOPY;  Service: Cardiovascular;  Laterality: N/A;    Family History   Problem Relation Age of Onset  . Lung cancer Mother   . Diabetes Father     a fib also    Social History:  reports that she has quit smoking. She does not have any smokeless tobacco history on file. She reports that she drinks alcohol. She reports that she does not use illicit drugs.  Allergies: No Known Allergies  Medications:  Scheduled: . apixaban  5 mg Oral BID  . atorvastatin  20 mg Oral q1800  . diltiazem  120 mg Oral Daily  . docusate sodium  100 mg Oral BID  . furosemide  40 mg Oral BID  . glipiZIDE  2.5 mg Oral BID AC  . insulin aspart  0-5 Units Subcutaneous QHS  . insulin aspart  0-9 Units Subcutaneous TID WC  . magnesium oxide  400 mg Oral BID  . metFORMIN  1,000 mg Oral BID WC  . metoprolol succinate  200 mg Oral Daily  . ondansetron  4 mg Intravenous Once  . pantoprazole  40 mg Oral Daily  . potassium chloride SA  40 mEq Oral BID  . promethazine  25 mg Oral TID  . sodium chloride  3 mL Intravenous Q12H  . sodium chloride  3 mL Intravenous Q12H   Continuous:   Results for orders placed during the hospital encounter of 05/26/13 (from the  past 24 hour(s))  GLUCOSE, CAPILLARY     Status: Abnormal   Collection Time    05/31/13  9:52 PM      Result Value Range   Glucose-Capillary 133 (*) 70 - 99 mg/dL   Comment 1 Documented in Chart     Comment 2 Notify RN    BASIC METABOLIC PANEL     Status: Abnormal   Collection Time    06/01/13  5:51 AM      Result Value Range   Sodium 134 (*) 135 - 145 mEq/L   Potassium 4.9  3.5 - 5.1 mEq/L   Chloride 94 (*) 96 - 112 mEq/L   CO2 30  19 - 32 mEq/L   Glucose, Bld 152 (*) 70 - 99 mg/dL   BUN 14  6 - 23 mg/dL   Creatinine, Ser 1.47  0.50 - 1.10 mg/dL   Calcium 9.5  8.4 - 82.9 mg/dL   GFR calc non Af Amer 64 (*) >90 mL/min   GFR calc Af Amer 74 (*) >90 mL/min  MAGNESIUM     Status: None   Collection Time    06/01/13  5:51 AM      Result Value Range   Magnesium 1.8  1.5 - 2.5 mg/dL  GLUCOSE, CAPILLARY     Status:  Abnormal   Collection Time    06/01/13  6:18 AM      Result Value Range   Glucose-Capillary 145 (*) 70 - 99 mg/dL  TSH     Status: None   Collection Time    06/01/13  8:40 AM      Result Value Range   TSH 0.892  0.350 - 4.500 uIU/mL  T4, FREE     Status: None   Collection Time    06/01/13  8:40 AM      Result Value Range   Free T4 1.77  0.80 - 1.80 ng/dL  HEPATIC FUNCTION PANEL     Status: Abnormal   Collection Time    06/01/13  8:40 AM      Result Value Range   Total Protein 6.6  6.0 - 8.3 g/dL   Albumin 2.5 (*) 3.5 - 5.2 g/dL   AST 11  0 - 37 U/L   ALT 15  0 - 35 U/L   Alkaline Phosphatase 137 (*) 39 - 117 U/L   Total Bilirubin 0.3  0.3 - 1.2 mg/dL   Bilirubin, Direct <5.6  0.0 - 0.3 mg/dL   Indirect Bilirubin NOT CALCULATED  0.3 - 0.9 mg/dL  GLUCOSE, CAPILLARY     Status: Abnormal   Collection Time    06/01/13 11:31 AM      Result Value Range   Glucose-Capillary 135 (*) 70 - 99 mg/dL   Comment 1 Notify RN    GLUCOSE, CAPILLARY     Status: Abnormal   Collection Time    06/01/13  4:17 PM      Result Value Range   Glucose-Capillary 100 (*) 70 - 99 mg/dL   Comment 1 Notify RN       No results found.  ROS:  As stated above in the HPI otherwise negative.  Blood pressure 103/48, pulse 85, temperature 97.6 F (36.4 C), temperature source Oral, resp. rate 17, height 5\' 3"  (1.6 m), weight 218 lb (98.884 kg), SpO2 91.00%.    PE: Gen: NAD, Alert and Oriented HEENT:  [place]/AT, EOMI Neck: Supple, no LAD Lungs: CTA Bilaterally CV: RRR without M/G/R ABM: Soft, NTND, +BS  Ext: No C/C/E  Assessment/Plan: 1) Intermittent vomiting. 2) Atrial fibrillation.   I am not able to identify the source of her vomiting.  There does not appear to be a pattern to her symptoms.  I will give her a try of scheduled phenergan to see if her symptoms of vomiting can be prevented or attenuated.  She tells me that she will be discharged in the AM.  Plan: 1) Phenergan TID. 2) Follow up in the  office in two weeks.  Ankush Gintz D 06/01/2013, 5:27 PM

## 2013-06-01 NOTE — Discharge Summary (Signed)
Discharge Summary   Patient ID: Sherry Vargas,  MRN: [id number], DOB/AGE: September 02, 1940 72 y.o.  Admit date: 05/26/2013 Discharge date: 06/02/2013  Primary Care Provider: [facility] Primary Cardiologist: J. Berlie Persky, MD   Discharge Diagnoses Principal Problem:   Acute diastolic CHF (congestive heart failure)  **In the setting of afib.  **Net-negative diuresis of > 8 Liters this admission with reduction in weight from 225 lbs on admission to 216 lbs on discharge.  Active Problems:   Atrial fibrillation  **s/p initially successful cardioversion with subsequent reversion to atrial fibrillation on 12/2.  **tikosyn initiated this admission but pt reverted to afib->tikosyn d/c'd and pt discharged on amiodarone.   Diabetes mellitus without complication   Hyperlipidemia   Peripheral vascular disease, unspecified   History of stroke   N&V - GI has seen   Generalized weakness/deconditioning  Allergies No Known Allergies  Procedures  Direct Current Cardioversion 12.1.2014  Conclusions:   1.  Successful cardioversion of afib to sinus rhythm with a single synchronized biphasic 200J shock 2.  No early apparent complications.   **Patient reverted to afib on the morning of 06/01/2013.** _____________   History of Present Illness  Sherry Vargas is a 72 yo woman with PMH of dyslipidemia, T2DM, atrial fibrillation, peripheral vascular disease who came into [facility] with significant shortness of breath and found to have pulmonary edema. She recently (05/24/13) had an elective cardioversion into NSR with other recent DCCV without success. At [facility] she was started on NTG gtt and lasix and transferred to [facility]  She was initially continued on her home dose of flecainide and metoprolol. She was placed on IV Cardizem for rate control. She was diuresed with IV Lasix.  She has some chest pain that was atypical for CAD. It appeared to be due to her atrial  fibrillation and CHF. Her cardiac enzymes were negative for MI. An outpatient Myoview is recommended once she is in sinus rhythm and more stable. This will be scheduled as an outpatient.  She was seen by electrophysiology who felt she had failed medical therapy with plica and it was discontinued. She was initially started on Tikosyn, once her flecainide had washed out. Potassium and magnesium were followed closely and repleted as needed.  Dr. Johney Frame contacted Dr. Darrick Penna regarding her clopidogrel. He follows her for peripheral vascular disease and her right femoropopliteal bypass is over 76 years old. It was felt the Plavix could be discontinued, as she has an ongoing need for oral anticoagulation.  By 05/31/2013, her respiratory status had improved and she had had 4 doses of Tikosyn. She was cardioverted and initially maintained sinus rhythm. However, she reverted to atrial fibrillation on 12/2. She was again evaluated by Dr. Johney Frame who felt changing to amiodarone was best option. Thyroid function testing plus hepatic function testing was performed. She had a mild increase in her alkaline phosphatase and she has hypoalbuminemia, but her thyroid functions were within normal limits. She was started on amiodarone at 400 mg 3 times a day.  She has had problems with nausea and vomiting. She was seen by gastroenterology. The vomiting is intermittent and Dr. Elnoria Howard was not able to identify the source or any kind of pattern. He recommended scheduled Phenergan 3 times a day, but Dr. Johney Frame felt this should be changed to 3 times a day when necessary. She is to followup with Dr. Elnoria Howard in 2 weeks.  She was felt to be a fall risk to 2 weakness and  deconditioning. Her balance is poor. She was seen by physical therapy. She lives with family who will be able to provide supervision and assistance. Home health PT was recommended and will be ordered. A home pulse oximeter and a grab-bar in the entryway was recommended as  well.  On 06/02/2013, she was seen by Dr. Johney Frame. He evaluated Sherry Vargas and reviewed all data. He felt she could be started on her amiodarone as she had been on Tikosyn for 48 hours. She is making progress with physical therapy and will continue this as an outpatient. The rate of her atrial fibrillation is improving on metoprolol and oral Cardizem. He did not feel any further inpatient workup was indicated and she is stable for discharge, to follow up as an outpatient.  Discharge Vitals Blood pressure 120/65, pulse 107, temperature 98.2 F (36.8 C), temperature source Oral, resp. rate 20, height 5\' 3"  (1.6 m), weight 216 lb 7.9 oz (98.2 kg), SpO2 96.00%.  Filed Weights   05/31/13 0449 06/01/13 0430 06/02/13 0518  Weight: 219 lb 12.8 oz (99.7 kg) 218 lb (98.884 kg) 216 lb 7.9 oz (98.2 kg)   Labs CBC Lab Results  Component Value Date   WBC 14.6* 05/26/2013   HGB 11.9* 05/26/2013   HCT 35.0* 05/26/2013   MCV 88.8 05/26/2013   PLT 332 05/26/2013    Basic Metabolic Panel  Recent Labs  05/31/13 0715 06/01/13 0551  NA 138 134*  K 4.7 4.9  CL 98 94*  CO2 31 30  GLUCOSE 142* 152*  BUN 15 14  CREATININE 0.75 0.89  CALCIUM 8.9 9.5  MG 1.8 1.8   Liver Function Tests  Recent Labs  06/01/13 0840  AST 11  ALT 15  ALKPHOS 137*  BILITOT 0.3  PROT 6.6  ALBUMIN 2.5*   Thyroid function tests Lab Results  Component Value Date   TSH 0.892 06/01/2013    free T4   1.77   06/01/2013    Disposition  Pt is being discharged home today in good condition.  Follow-up Plans & Appointments      Follow-up Information   Follow up with [facility].   Contact information:   [address] [place] [address] [phone]       Follow up with Hillis Range, MD On 06/09/2013. (3:15 PM)    Specialty:  Cardiology   Contact information:   [address] [place] [address] [phone]      Discharge Medications    Medication List     STOP taking these medications       clopidogrel 75 MG tablet  Commonly known as:  PLAVIX     flecainide 100 MG tablet  Commonly known as:  TAMBOCOR     lisinopril 5 MG tablet  Commonly known as:  PRINIVIL,ZESTRIL      TAKE these medications       amiodarone 400 MG tablet  Commonly known as:  PACERONE  Take 1 tablet (400 mg total) by mouth 3 (three) times daily.     apixaban 5 MG Tabs tablet  Commonly known as:  ELIQUIS  Take 1 tablet (5 mg total) by mouth 2 (two) times daily.     diltiazem 120 MG 24 hr capsule  Commonly known as:  CARDIZEM CD  Take 1 capsule (120 mg total) by mouth daily.     furosemide 40 MG tablet  Commonly known as:  LASIX  Take 1 tablet (40 mg total) by  mouth 2 (two) times daily.     glipiZIDE 5 MG tablet  Commonly known as:  GLUCOTROL  Take 0.5 tablets (2.5 mg total) by mouth 2 (two) times daily.     ICAPS PO  Take 1 tablet by mouth daily.     IRON PO  Take 325 mg by mouth daily.     metFORMIN 500 MG tablet  Commonly known as:  GLUCOPHAGE  Take 2 tablets (1,000 mg total) by mouth 2 (two) times daily with a meal.     metoprolol succinate 100 MG 24 hr tablet  Commonly known as:  TOPROL-XL  Take 2 tablets (200 mg total) by mouth daily.     pantoprazole 40 MG tablet  Commonly known as:  PROTONIX  Take 1 tablet (40 mg total) by mouth daily.     potassium chloride SA 20 MEQ tablet  Commonly known as:  K-DUR,KLOR-CON  Take 1 tablet (20 mEq total) by mouth 2 (two) times daily.     rosuvastatin 10 MG tablet  Commonly known as:  CRESTOR  Take 1 tablet (10 mg total) by mouth daily.       Outstanding Labs/Studies None  Duration of Discharge Encounter   Greater than 30 minutes including physician time.  Bobbye Riggs Barrett PAC  06/02/2013, 8:06 AM  Hillis Range MD

## 2013-06-01 NOTE — Progress Notes (Signed)
Pt vomited after taking morning meds.  Pt said it was most of her breakfast.  Will notify MD.

## 2013-06-01 NOTE — Progress Notes (Signed)
Pt's heart rhythm converted to Afib from NSR this morning, HR was 110-140's sustaining, VS to chart, EKG done, not complaining of chest pain, not in respiratory distress, no nausea and vomiting. Metoprolol IV prn given as ordered but still HR 110-140's. Dr. Terressa Koyanagi was aware. Diltiazem PO given as prn order. Will continue to monitor pt and advised on bedrest for now.

## 2013-06-01 NOTE — Progress Notes (Signed)
Physical Therapy Treatment Patient Details Name: Sherry Vargas MRN: [id number] DOB: 02-Jun-1941 Today's Date: 06/01/2013 Time: 4098-1191 PT Time Calculation (min): 40 min  PT Assessment / Plan / Recommendation  History of Present Illness Sherry Vargas is a 72 yo woman with PMH of dyslipidemia, T2DM, atrial fibrillation, peripheral vascular disease who came into [facility] with significant shortness of breath and found to have pulmonary edema.  At [facility] she was started on NTG gtt and lasix and transferred to [facility].  Patient with recent cardioversion - unsuccessful.  Meds unsuccessful.  Per MD note - to start new medication for Afib on Sat/Sun.   PT Comments   Patient tolerated activity with no concerning changes in HR 92-116. She reports noticeable general bilateral LE weakness during ambulation compared to her prior level. Increased lateral sway/control during ambulation and decreased performance on Berg balance assessment (score 42 today) make her a significant fall risk. Education given on using her cane in the house and energy conservation in lifting and carrying in order to increase endurance, and ultimately decrease caregiver burden. Recommended home pulse oximeter and addition of a grab bar for entryway for increased safety.  Follow Up Recommendations  Home health PT;Supervision - Intermittent     Does the patient have the potential to tolerate intense rehabilitation     Barriers to Discharge        Equipment Recommendations  None recommended by PT    Recommendations for Other Services    Frequency Min 3X/week   Progress towards PT Goals Progress towards PT goals: Goals met and updated - see care plan  Plan Current plan remains appropriate    Precautions / Restrictions Precautions Precautions: Fall Precaution Comments: berg score 42 Restrictions Weight Bearing Restrictions: No   Pertinent Vitals/Pain SpO2 94% RA at rest, HR 92; 96% RA with activity, HR 95-116; denies  any pain.    Mobility  Bed Mobility Bed Mobility: Supine to Sit;Sitting - Scoot to Edge of Bed Supine to Sit: 6: Modified independent (Device/Increase time) Sitting - Scoot to Edge of Bed: 6: Modified independent (Device/Increase time) Details for Bed Mobility Assistance: no cues needed;  Transfers Transfers: Sit to Stand;Stand to Sit Sit to Stand: 6: Modified independent (Device/Increase time);From bed;From chair/3-in-1;Without upper extremity assist Stand to Sit: 6: Modified independent (Device/Increase time);To chair/3-in-1;To bed;With upper extremity assist Details for Transfer Assistance: sit to stand x3 performed slowly to increase endurance; no cues needed; pt able to stand without UE support, but safer with support due to balance deficiency Ambulation/Gait Ambulation/Gait Assistance: 5: Supervision Ambulation Distance (Feet): 450 Feet Assistive device: None Ambulation/Gait Assistance Details: reports unsteadness; supervision for safety Gait Pattern: Step-through pattern;Decreased stride length;Wide base of support Gait velocity: 30'/27sec or 1.40ft/sec General Gait Details: reports unsteadiness in gait; needs to begin increased use cane for better balance and safety inside home; Stairs: Yes Stairs Assistance: 5: Supervision Stairs Assistance Details (indicate cue type and reason): needed cues for hand placement, UE support; and supervision for safety Stair Management Technique: One rail Left;Step to pattern;Forwards Number of Stairs: 3    Exercises General Exercises - Lower Extremity Toe Raises: AROM;Both;20 reps;Seated   PT Diagnosis:    PT Problem List:   PT Treatment Interventions:     PT Goals (current goals can now be found in the care plan section) Acute Rehab PT Goals Patient Stated Goal: to go home PT Goal Formulation: With patient Time For Goal Achievement: 06/08/13 Potential to Achieve Goals: Good Additional Goals Additional Goal #  1: Patient will score > or  = to 50 on Berg to decrease fall risk.  Visit Information  Last PT Received On: 06/01/13 Assistance Needed: +1 History of Present Illness: Sherry Vargas is a 72 yo woman with PMH of dyslipidemia, T2DM, atrial fibrillation, peripheral vascular disease who came into [facility] with significant shortness of breath and found to have pulmonary edema.  At [facility] she was started on NTG gtt and lasix and transferred to [facility].  Patient with recent cardioversion - unsuccessful.  Meds unsuccessful.  Per MD note - to start new medication for Afib on Sat/Sun.    Subjective Data  Patient Stated Goal: to go home   Cognition  Cognition Arousal/Alertness: Awake/alert Behavior During Therapy: WFL for tasks assessed/performed Overall Cognitive Status: Within Functional Limits for tasks assessed    Balance  Standardized Balance Assessment Standardized Balance Assessment: Berg Balance Test Berg Balance Test Sit to Stand: Able to stand without using hands and stabilize independently Standing Unsupported: Able to stand safely 2 minutes Sitting with Back Unsupported but Feet Supported on Floor or Stool: Able to sit safely and securely 2 minutes Stand to Sit: Sits safely with minimal use of hands Transfers: Able to transfer safely, minor use of hands Standing Unsupported with Eyes Closed: Able to stand 10 seconds with supervision Standing Ubsupported with Feet Together: Able to place feet together independently and stand for 1 minute with supervision From Standing, Reach Forward with Outstretched Arm: Can reach confidently >25 cm (10") From Standing Position, Pick up Object from Floor: Able to pick up shoe safely and easily From Standing Position, Turn to Look Behind Over each Shoulder: Looks behind from both sides and weight shifts well Turn 360 Degrees: Able to turn 360 degrees safely but slowly Standing Unsupported, Alternately Place Feet on Step/Stool: Able to complete >2 steps/needs minimal  assist Standing Unsupported, One Foot in Front: Loses balance while stepping or standing Standing on One Leg: Tries to lift leg/unable to hold 3 seconds but remains standing independently Total Score: 42  End of Session PT - End of Session Equipment Utilized During Treatment: Gait belt;Oxygen Activity Tolerance: Patient tolerated treatment well Patient left: in chair;with call bell/phone within reach;with family/visitor present Nurse Communication: Mobility status   GP     Willette Pa SPT 06/01/2013, 1:33 PM

## 2013-06-02 ENCOUNTER — Encounter (HOSPITAL_COMMUNITY): Payer: Self-pay | Admitting: Internal Medicine

## 2013-06-02 LAB — GLUCOSE, CAPILLARY: Glucose-Capillary: 117 mg/dL — ABNORMAL HIGH (ref 70–99)

## 2013-06-02 MED ORDER — PROMETHAZINE HCL 25 MG PO TABS: 12.5000 mg | ORAL_TABLET | Freq: Three times a day (TID) | ORAL | Status: DC | PRN

## 2013-06-02 NOTE — Progress Notes (Signed)
SUBJECTIVE: The patient is doing well this morning.  At this time, she denies chest pain, shortness of breath, or any new concerns.  GI consult note reviewed - appreciate their input. Pt is comfortable with going home with scheduled Phenergan per their recommendations and follow up in 2 weeks with them.   CURRENT MEDICATIONS: . apixaban  5 mg Oral BID  . atorvastatin  20 mg Oral q1800  . diltiazem  120 mg Oral Daily  . docusate sodium  100 mg Oral BID  . furosemide  40 mg Oral BID  . glipiZIDE  2.5 mg Oral BID AC  . insulin aspart  0-5 Units Subcutaneous QHS  . insulin aspart  0-9 Units Subcutaneous TID WC  . magnesium oxide  400 mg Oral BID  . metFORMIN  1,000 mg Oral BID WC  . metoprolol succinate  200 mg Oral Daily  . ondansetron  4 mg Intravenous Once  . pantoprazole  40 mg Oral Daily  . potassium chloride SA  40 mEq Oral BID  . promethazine  25 mg Oral TID  . sodium chloride  3 mL Intravenous Q12H  . sodium chloride  3 mL Intravenous Q12H      OBJECTIVE: Physical Exam: Filed Vitals:   06/01/13 0936 06/01/13 1312 06/01/13 2058 06/02/13 0518  BP: 100/48 103/48 111/64 120/65  Pulse:  85 107 107  Temp:  97.6 F (36.4 C) 98.2 F (36.8 C) 98.2 F (36.8 C)  TempSrc:  Oral Oral Oral  Resp:  17 18 20   Height:      Weight:    216 lb 7.9 oz (98.2 kg)  SpO2:  91% 92% 96%    Intake/Output Summary (Last 24 hours) at 06/02/13 0655 Last data filed at 06/02/13 0519  Gross per 24 hour  Intake    580 ml  Output   2251 ml  Net  -1671 ml    Telemetry reveals atrial fibrillation, ventricular rates 90-110  GEN- The patient is well appearing, alert and oriented x 3 today.   Head- normocephalic, atraumatic Eyes-  Sclera clear, conjunctiva pink Ears- hearing intact Oropharynx- clear Neck- supple, no JVP Lymph- no cervical lymphadenopathy Lungs- Clear to ausculation bilaterally, normal work of breathing Heart- irregular rate and rhythm, no murmurs, rubs or gallops, PMI not  laterally displaced GI- soft, NT, ND, + BS Extremities- no clubbing, cyanosis, or edema Skin- no rash or lesion Psych- euthymic mood, full affect Neuro- strength and sensation are intact  LABS: Basic Metabolic Panel:  Recent Labs  40/98/11 0715 06/01/13 0551  NA 138 134*  K 4.7 4.9  CL 98 94*  CO2 31 30  GLUCOSE 142* 152*  BUN 15 14  CREATININE 0.75 0.89  CALCIUM 8.9 9.5  MG 1.8 1.8    RADIOLOGY: Dg Chest Portable 1 View 05/26/2013   CLINICAL DATA:  Shortness of breath and nausea tonight.  EXAM: PORTABLE CHEST - 1 VIEW  COMPARISON:  None.  FINDINGS: Mild cardiac enlargement with pulmonary vascular congestion. Interstitial changes suggest interstitial edema. No focal consolidation. No blunting of costophrenic angles. No pneumothorax.  IMPRESSION: Cardiac enlargement with pulmonary vascular congestion and interstitial edema.   Electronically Signed   By: Burman Nieves M.D.   On: 05/26/2013 01:40    ASSESSMENT AND PLAN:  Principal Problem:   Acute diastolic CHF (congestive heart failure) Active Problems:   Atrial fibrillation   Hyperlipidemia   Diabetes mellitus without complication   Peripheral vascular disease, unspecified   History of stroke  1. Acute diastolic CHF  Likely exacerbated by afib with RVR  Rate control with metoprolol  Continue lasix 40mg  BID  2. Chest discomfort  Appears to be due to afib and CHF. My suspicion for ischemic is low  Rate control afib and proceed with pursuit of sinus rhythm  myoview once in sinus rhythm and more stable   3. Persistent afib  She has failed medical therapy with flecainide and Tikosyn Start amiodarone 400mg  TID starting tomorrow am. Check LFTs/TFTs I will rate control with metoprolol in the interim.  Continue diltiazem 120CD daily Continue eliquis  4. PVD  Stable  Stop plavix as per my discussion with Dr Darrick Penna  5. Nausea I have recommended that she take her phenergen only as needed Followup with GI in two  weeks Importantly, these symptoms proceeded initiation of amiodarone.  DC to home today Follow-up with me as scheduled next week

## 2013-06-07 ENCOUNTER — Telehealth: Payer: Self-pay | Admitting: Family Medicine

## 2013-06-07 ENCOUNTER — Encounter: Payer: Medicare Other | Admitting: Internal Medicine

## 2013-06-07 NOTE — Telephone Encounter (Signed)
Noted  

## 2013-06-07 NOTE — Telephone Encounter (Signed)
Sam left a message today stating that he works for [facility] and he will be visiting the pt this week per patient request And if we have any questions feel free to call him at [phone]

## 2013-06-09 ENCOUNTER — Ambulatory Visit (INDEPENDENT_AMBULATORY_CARE_PROVIDER_SITE_OTHER): Payer: Medicare Other | Admitting: Internal Medicine

## 2013-06-09 ENCOUNTER — Encounter: Payer: Self-pay | Admitting: Internal Medicine

## 2013-06-09 VITALS — BP 105/72 | HR 72 | Ht 63.0 in | Wt 209.8 lb

## 2013-06-09 DIAGNOSIS — I4891 Unspecified atrial fibrillation: Secondary | ICD-10-CM

## 2013-06-09 MED ORDER — AMIODARONE HCL 400 MG PO TABS: 400.0000 mg | ORAL_TABLET | Freq: Two times a day (BID) | ORAL | Status: DC

## 2013-06-09 MED ORDER — METOPROLOL SUCCINATE ER 100 MG PO TB24: 200.0000 mg | ORAL_TABLET | Freq: Every day | ORAL | Status: DC

## 2013-06-09 NOTE — Progress Notes (Signed)
PCP:  Leo Grosser, MD  The patient presents today for routine electrophysiology followup.  She failed tikosyn recently.  She has been placed on amiodarone and is now in sinus rhythm.  She has had difficulty with nausea which proceeded amiodarone.  She is followed by GI for this. Today, she denies symptoms of palpitations, chest pain, shortness of breath, orthopnea, PND, lower extremity edema, dizziness, presyncope, syncope, or neurologic sequela.  The patient feels that she is tolerating medications without difficulties and is otherwise without complaint today.   Past Medical History  Diagnosis Date  . HLD (hyperlipidemia)   . DM (dermatomyositis)   . PAD (peripheral artery disease)   . 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 Paroxysmal a-fib     post operative following fem bypass at [facility] 12/11/10  . Hypertension   . Diabetes mellitus without complication   . Stroke    Past Surgical History  Procedure Laterality Date  . Adenoidectomy    . Breast lumpectomy      benign  . Knee cartilage surgery    . Intraoperative arteriogram      for claudication in L leg 2004 at [facility]  . Endovascular stents      R leg at [facility]  . Femoral bypass  12/11/10    R leg at [facility]  . Cardioversion N/A 04/27/2013    Procedure: CARDIOVERSION;  Surgeon: Wendall Stade, MD;  Location: [facility] ENDOSCOPY;  Service: Cardiovascular;  Laterality: N/A;  . Cardioversion N/A 05/24/2013    Procedure: CARDIOVERSION;  Surgeon: Pricilla Riffle, MD;  Location: [facility] ENDOSCOPY;  Service: Cardiovascular;  Laterality: N/A;  . Cardioversion N/A 05/31/2013    Procedure: CARDIOVERSION;  Surgeon: Hillis Range, MD;  Location: [facility] OR;  Service: Cardiovascular;  Laterality: N/A;    Current Outpatient Prescriptions  Medication Sig Dispense Refill  . amiodarone (PACERONE) 400 MG tablet Take 1 tablet (400 mg total) by mouth 3 (three) times daily.  90 tablet  2  . apixaban (ELIQUIS) 5 MG TABS tablet Take 1 tablet (5 mg  total) by mouth 2 (two) times daily.  60 tablet  11  . diltiazem (CARDIZEM CD) 120 MG 24 hr capsule Take 1 capsule (120 mg total) by mouth daily.  30 capsule  1  . furosemide (LASIX) 40 MG tablet Take 1 tablet (40 mg total) by mouth 2 (two) times daily.  60 tablet  6  . glipiZIDE (GLUCOTROL) 5 MG tablet Take 0.5 tablets (2.5 mg total) by mouth 2 (two) times daily.  90 tablet  3  . IRON PO Take 325 mg by mouth daily.       . metFORMIN (GLUCOPHAGE) 500 MG tablet Take 2 tablets (1,000 mg total) by mouth 2 (two) times daily with a meal.  360 tablet  3  . metoprolol succinate (TOPROL-XL) 100 MG 24 hr tablet Take 2 tablets (200 mg total) by mouth daily.  30 tablet  1  . Multiple Vitamins-Minerals (ICAPS PO) Take 1 tablet by mouth daily.      . pantoprazole (PROTONIX) 40 MG tablet Take 1 tablet (40 mg total) by mouth daily.  90 tablet  3  . potassium chloride SA (K-DUR,KLOR-CON) 20 MEQ tablet Take 1 tablet (20 mEq total) by mouth 2 (two) times daily.  180 tablet  3  . promethazine (PHENERGAN) 25 MG tablet Take 0.5-1 tablets (12.5-25 mg total) by mouth every 8 (eight) hours as needed for nausea or vomiting.  30 tablet  0  .  rosuvastatin (CRESTOR) 10 MG tablet Take 1 tablet (10 mg total) by mouth daily.  90 tablet  3   No current facility-administered medications for this visit.    No Known Allergies  History   Social History  . Marital Status: Single    Spouse Name: N/A    Number of Children: N/A  . Years of Education: N/A   Occupational History  . Not on file.   Social History Main Topics  . Smoking status: Former Games developer  . Smokeless tobacco: Not on file     Comment: quit 2005  . Alcohol Use: Yes     Comment: occasionally  . Drug Use: No  . Sexual Activity: Not on file   Other Topics Concern  . Not on file   Social History Narrative   Lives in [place], near [place].   Retired Product/process development scientist    Family History  Problem Relation Age of Onset  . Lung cancer Mother    . Diabetes Father     a fib also    ROS-  All systems are reviewed and are negative except as outlined in the HPI above  Physical Exam: Filed Vitals:   06/09/13 1511  BP: 105/72  Pulse: 72  Height: 5\' 3"  (1.6 m)  Weight: 209 lb 12.8 oz (95.165 kg)    GEN- The patient is well appearing, alert and oriented x 3 today.   Head- normocephalic, atraumatic Eyes-  Sclera clear, conjunctiva pink Ears- hearing intact Oropharynx- clear Neck- supple, no JVP Lymph- no cervical lymphadenopathy Lungs- Clear to ausculation bilaterally, normal work of breathing Heart- Regular rate and rhythm, no murmurs, rubs or gallops, PMI not laterally displaced GI- soft, NT, ND, + BS Extremities- no clubbing, cyanosis, or edema  ekg today reveals sinus rhythm 70 bpm, otherwise normal ekg  Assessment and Plan:  1. afib Decrease amiodarone to 400mg  BID x 1 week then 200mg  BID afterwards Stop diltiazem Continue eliquis Bmet, cbc and LFTs today  2. Chronic diastolic dysfunction Clinically much improved  Return to see me in 2 weeks

## 2013-06-09 NOTE — Patient Instructions (Addendum)
Your physician recommends that you schedule a follow-up appointment in: 2 weeks with Dr Johney Frame  Your physician has recommended you make the following change in your medication:  1) Decrease Amiodarone to 400mg  twice daily for 1 week, then decrease to 200mg  twice daily 2) Stop Diltiazem  Your physician recommends that you return for lab work today BMP/CBC/liver

## 2013-06-10 ENCOUNTER — Telehealth: Payer: Self-pay | Admitting: *Deleted

## 2013-06-10 DIAGNOSIS — I5031 Acute diastolic (congestive) heart failure: Secondary | ICD-10-CM

## 2013-06-10 LAB — BASIC METABOLIC PANEL
BUN: 39 mg/dL — ABNORMAL HIGH (ref 6–23)
CO2: 30 mEq/L (ref 19–32)
Calcium: 9.6 mg/dL (ref 8.4–10.5)
Creatinine, Ser: 1.8 mg/dL — ABNORMAL HIGH (ref 0.4–1.2)
Glucose, Bld: 85 mg/dL (ref 70–99)
Sodium: 136 mEq/L (ref 135–145)

## 2013-06-10 LAB — CBC WITH DIFFERENTIAL/PLATELET
Basophils Absolute: 0.1 10*3/uL (ref 0.0–0.1)
Basophils Relative: 0.6 % (ref 0.0–3.0)
Eosinophils Absolute: 0.1 10*3/uL (ref 0.0–0.7)
Lymphocytes Relative: 24.5 % (ref 12.0–46.0)
MCHC: 32.9 g/dL (ref 30.0–36.0)
MCV: 85.6 fl (ref 78.0–100.0)
Monocytes Absolute: 0.8 10*3/uL (ref 0.1–1.0)
Neutrophils Relative %: 67.3 % (ref 43.0–77.0)
Platelets: 668 10*3/uL — ABNORMAL HIGH (ref 150.0–400.0)
RBC: 4.68 Mil/uL (ref 3.87–5.11)
RDW: 14.6 % (ref 11.5–14.6)

## 2013-06-10 LAB — HEPATIC FUNCTION PANEL
Alkaline Phosphatase: 105 U/L (ref 39–117)
Bilirubin, Direct: 0.1 mg/dL (ref 0.0–0.3)
Total Bilirubin: 0.5 mg/dL (ref 0.3–1.2)
Total Protein: 8.4 g/dL — ABNORMAL HIGH (ref 6.0–8.3)

## 2013-06-10 NOTE — Telephone Encounter (Signed)
Dr. Swaz[place] reviewed pt BMP results with recommendations for pt to stop furosemide & potassium until redraw of BMP next Wednesday. Encourage fluids Pt is aware & will come in next Wednesday for BMP Mylo Red RN

## 2013-06-16 ENCOUNTER — Other Ambulatory Visit: Payer: Medicare Other

## 2013-06-16 DIAGNOSIS — I5031 Acute diastolic (congestive) heart failure: Secondary | ICD-10-CM

## 2013-06-17 ENCOUNTER — Telehealth: Payer: Self-pay | Admitting: Internal Medicine

## 2013-06-17 NOTE — Telephone Encounter (Signed)
Spoke with HHN and let her know the patient needs to have a repeat BMP that ws to be done on Wed due to elevated BUN and Creatine She is going to see the patient tomorrow and draw and call me with the results

## 2013-06-17 NOTE — Telephone Encounter (Signed)
New message  [name] with [facility] called states the pt is not currently on a diuretic,has gained 2.4 lbs over night BP is higher than normal ( No reading), Swelling in ankles. She asks should the pt go back on a diurectic?Sherry Vargas.Please call back to discuss.

## 2013-06-18 ENCOUNTER — Encounter: Payer: Self-pay | Admitting: Internal Medicine

## 2013-06-22 NOTE — Telephone Encounter (Signed)
12/16--209.4 and now 214.6 and has swelling Will restart the Furosemide at 40mg  every morning and of Potassium every morning  Will recheck a  BMP on Fri.  She will continue to weigh herself and agrees wih paln.

## 2013-06-23 NOTE — Telephone Encounter (Signed)
Follow up    Patient told home health she need lab work . Need a verbal on what labs to draw.

## 2013-06-23 NOTE — Telephone Encounter (Signed)
Spoke with [facility] and will need a repeat BMP on Fri after restarting medications

## 2013-06-25 ENCOUNTER — Encounter: Payer: Self-pay | Admitting: Internal Medicine

## 2013-06-29 NOTE — Telephone Encounter (Addendum)
Spoke with patient and let her know her labs looked much better.  Her kidney function is back to normal. She says her swelling is better especially when she sleeps in her bed all night with her feet elevated

## 2013-06-29 NOTE — Telephone Encounter (Signed)
Left message for patient to call me back.  Her labs look much better

## 2013-06-29 NOTE — Telephone Encounter (Signed)
Follow Up:  Pt is returning [facility] call.

## 2013-06-30 ENCOUNTER — Encounter: Payer: Self-pay | Admitting: Internal Medicine

## 2013-06-30 ENCOUNTER — Ambulatory Visit (INDEPENDENT_AMBULATORY_CARE_PROVIDER_SITE_OTHER): Payer: Medicare Other | Admitting: Internal Medicine

## 2013-06-30 VITALS — BP 116/60 | HR 57 | Ht 63.0 in | Wt 211.8 lb

## 2013-06-30 DIAGNOSIS — I5031 Acute diastolic (congestive) heart failure: Secondary | ICD-10-CM

## 2013-06-30 DIAGNOSIS — I4891 Unspecified atrial fibrillation: Secondary | ICD-10-CM

## 2013-06-30 DIAGNOSIS — I509 Heart failure, unspecified: Secondary | ICD-10-CM

## 2013-06-30 DIAGNOSIS — N179 Acute kidney failure, unspecified: Secondary | ICD-10-CM | POA: Insufficient documentation

## 2013-06-30 MED ORDER — AMIODARONE HCL 200 MG PO TABS: 200.0000 mg | ORAL_TABLET | Freq: Every day | ORAL | Status: DC

## 2013-06-30 MED ORDER — METOPROLOL SUCCINATE ER 100 MG PO TB24: 100.0000 mg | ORAL_TABLET | Freq: Every day | ORAL | Status: DC

## 2013-06-30 NOTE — Progress Notes (Signed)
PCP:  Leo Grosser, MD  The patient presents today for routine electrophysiology followup. She is back in sinus and doing much better. Her nausea (not amiodarone related) is improved. Today, she denies symptoms of palpitations, chest pain, shortness of breath, orthopnea, PND, lower extremity edema, dizziness, presyncope, syncope, or neurologic sequela.  The patient feels that she is tolerating medications without difficulties and is otherwise without complaint today.   Past Medical History  Diagnosis Date  . HLD (hyperlipidemia)   . DM (dermatomyositis)   . PAD (peripheral artery disease)   . 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 Paroxysmal a-fib     post operative following fem bypass at [facility] 12/11/10  . Hypertension   . Diabetes mellitus without complication   . Stroke    Past Surgical History  Procedure Laterality Date  . Adenoidectomy    . Breast lumpectomy      benign  . Knee cartilage surgery    . Intraoperative arteriogram      for claudication in L leg 2004 at [facility]  . Endovascular stents      R leg at [facility]  . Femoral bypass  12/11/10    R leg at [facility]  . Cardioversion N/A 04/27/2013    Procedure: CARDIOVERSION;  Surgeon: Wendall Stade, MD;  Location: [facility] ENDOSCOPY;  Service: Cardiovascular;  Laterality: N/A;  . Cardioversion N/A 05/24/2013    Procedure: CARDIOVERSION;  Surgeon: Pricilla Riffle, MD;  Location: [facility] ENDOSCOPY;  Service: Cardiovascular;  Laterality: N/A;  . Cardioversion N/A 05/31/2013    Procedure: CARDIOVERSION;  Surgeon: Hillis Range, MD;  Location: [facility] OR;  Service: Cardiovascular;  Laterality: N/A;    Current Outpatient Prescriptions  Medication Sig Dispense Refill  . amiodarone (PACERONE) 400 MG tablet Take 1 tablet (400 mg total) by mouth 2 (two) times daily. Take 400mg  bid for 1 week then decrease to 200mg (1/2 tablet) twice daily  180 tablet  3  . apixaban (ELIQUIS) 5 MG TABS tablet Take 1 tablet (5 mg total) by mouth 2 (two) times  daily.  60 tablet  11  . furosemide (LASIX) 40 MG tablet Take 40 mg by mouth daily.      [name] glipiZIDE (GLUCOTROL) 5 MG tablet Take 0.5 tablets (2.5 mg total) by mouth 2 (two) times daily.  90 tablet  3  . IRON PO Take 325 mg by mouth daily.       . metFORMIN (GLUCOPHAGE) 500 MG tablet Take 2 tablets (1,000 mg total) by mouth 2 (two) times daily with a meal.  360 tablet  3  . metoprolol succinate (TOPROL-XL) 100 MG 24 hr tablet Take 2 tablets (200 mg total) by mouth daily.  180 tablet  3  . Multiple Vitamins-Minerals (ICAPS PO) Take 1 tablet by mouth daily.      . pantoprazole (PROTONIX) 40 MG tablet Take 1 tablet (40 mg total) by mouth daily.  90 tablet  3  . potassium chloride SA (K-DUR,KLOR-CON) 20 MEQ tablet Take 20 mEq by mouth daily.      . promethazine (PHENERGAN) 25 MG tablet Take 0.5-1 tablets (12.5-25 mg total) by mouth every 8 (eight) hours as needed for nausea or vomiting.  30 tablet  0  . rosuvastatin (CRESTOR) 10 MG tablet Take 1 tablet (10 mg total) by mouth daily.  90 tablet  3   No current facility-administered medications for this visit.    No Known Allergies  History   Social History  . Marital Status:  Single    Spouse Name: N/A    Number of Children: N/A  . Years of Education: N/A   Occupational History  . Not on file.   Social History Main Topics  . Smoking status: Former Games developer  . Smokeless tobacco: Not on file     Comment: quit 2005  . Alcohol Use: Yes     Comment: occasionally  . Drug Use: No  . Sexual Activity: Not on file   Other Topics Concern  . Not on file   Social History Narrative   Lives in [place], near [place].   Retired Product/process development scientist    Family History  Problem Relation Age of Onset  . Lung cancer Mother   . Diabetes Father     a fib also    Physical Exam: Filed Vitals:   06/30/13 1415  BP: 116/60  Pulse: 57  Height: 5\' 3"  (1.6 m)  Weight: 211 lb 12.8 oz (96.072 kg)    GEN- The patient is well appearing,  alert and oriented x 3 today.   Head- normocephalic, atraumatic Eyes-  Sclera clear, conjunctiva pink Ears- hearing intact Oropharynx- clear Neck- supple, no JVP Lymph- no cervical lymphadenopathy Lungs- Clear to ausculation bilaterally, normal work of breathing Heart- Regular rate and rhythm, no murmurs, rubs or gallops, PMI not laterally displaced GI- soft, NT, ND, + BS Extremities- no clubbing, cyanosis, or edema  ekg today reveals sinus rhythm 57 bpm, otherwise normal ekg  Assessment and Plan:  1. afib Decrease amiodarone to 200mg  daily Decrease metoprolol to 100mg  daily Continue eliquis  2. Chronic diastolic dysfunction Clinically much improved  3. Acute renal failure Resolved with reduction in lasix back to baseline dose  Return to see Lawson Fiscal in 4 weeks I will see in 8 weeks

## 2013-06-30 NOTE — Patient Instructions (Addendum)
Your physician recommends that you schedule a follow-up appointment in: 4 weeks with Norma Fredrickson, NP and 8 weeks with Dr Johney Frame  Your physician has recommended you make the following change in your medication:  1) Decrease Amiodarone to 200mg  daily 2) Decrease Toprol to 100mg  daily

## 2013-07-20 ENCOUNTER — Encounter: Payer: Self-pay | Admitting: Internal Medicine

## 2013-07-23 ENCOUNTER — Encounter: Payer: Self-pay | Admitting: *Deleted

## 2013-07-23 ENCOUNTER — Encounter: Payer: Medicare Other | Attending: Cardiology | Admitting: *Deleted

## 2013-07-23 VITALS — Ht 63.0 in | Wt 211.4 lb

## 2013-07-23 DIAGNOSIS — I509 Heart failure, unspecified: Secondary | ICD-10-CM

## 2013-07-23 DIAGNOSIS — Z713 Dietary counseling and surveillance: Secondary | ICD-10-CM | POA: Insufficient documentation

## 2013-07-23 DIAGNOSIS — E119 Type 2 diabetes mellitus without complications: Secondary | ICD-10-CM | POA: Insufficient documentation

## 2013-07-23 NOTE — Patient Instructions (Signed)
Plan:  Aim for 3 Carb Choices per meal (45 grams) +/- 1 either way  Aim for 0-1 Carbs per snack if hungry Consider including some protein with your meals and snacks  Consider reading food labels for Total Carbohydrate of foods Consider ordering your strips from your insurance company so you can check your BG more routinely Check into the [facility] or [facility] OR consider doing your Arm Chair Exercises again.

## 2013-07-23 NOTE — Progress Notes (Signed)
Appt start time: 0900 end time:  1030.  Assessment:  Patient was seen on  07/23/13 for individual diabetes education. She is here with her son who does the cooking in the family. She was recently hospitalized for pulmonary edema and CHF. She typically checks her BG once or twice a day, with reported range of 90-160 both pre and post meal. She reports low BG when meals are delayed, she treats with PNB crackers OR regular meal quickly.  Current HbA1c: 6.5% pm 03/19/13  Preferred Learning Style:   No preference indicated   Learning Readiness:   Contemplating  MEDICATIONS: see list. Diabetes medication include Metformin and Glipizide  Usual physical activity: not at this time  Estimated energy needs: 1400 calories 158 g carbohydrates 105 g protein 39 g fat  Intervention:  Nutrition counseling provided.  Discussed diabetes disease process and treatment options.  Discussed physiology of diabetes and role of obesity on insulin resistance.  Encouraged moderate weight reduction to improve glucose levels.  Discussed role of medications and diet in glucose control  Provided education on macronutrients on glucose levels.  Provided education on carb counting, importance of regularly scheduled meals/snacks, and meal planning  Discussed effects of physical activity on glucose levels and long-term glucose control.  Recommended she consider ways to increase her  physical activity/week.  Reviewed patient medications.  Discussed role of medication on blood glucose and possible side effects  Discussed blood glucose monitoring and interpretation.  Discussed recommended target ranges and individual ranges.    Described short-term complications: hyper- and hypo-glycemia.  Discussed causes,symptoms, and treatment options.  Provided information on prevention, detection, and treatment of long-term complications.  Discussed the role of prolonged elevated glucose levels on body systems.  Provided  information on role of stress on blood glucose levels and discussed strategies to manage psychosocial issues.  Provided information on recommendations for long-term diabetes self-care.  Established checklist for medical, dental, and emotional self-care.  Plan:   Aim for 3 Carb Choices per meal (45 grams) +/- 1 either way  Aim for 0-1 Carbs per snack if hungry Consider including some protein with your meals and snacks  Consider reading food labels for Total Carbohydrate of foods Consider ordering your strips from your insurance company so you can check your BG more routinely Check into the [facility] or [facility] OR consider doing your Arm Chair Exercises again.   Teaching Method Utilized: Visual, Auditory and Hands on  Handouts given during visit include: Living Well with Diabetes Carb Counting and Food Label handouts Meal Plan Card  Barriers to learning/adherence to lifestyle change: limited mobility  Diabetes self-care support plan:   [facility] support group  Demonstrated degree of understanding via:  Teach Back   Monitoring/Evaluation:  Dietary intake, exercise, reading food labels, and body weight prn.

## 2013-07-27 ENCOUNTER — Encounter: Payer: Self-pay | Admitting: Nurse Practitioner

## 2013-07-27 ENCOUNTER — Ambulatory Visit (INDEPENDENT_AMBULATORY_CARE_PROVIDER_SITE_OTHER): Payer: Medicare Other | Admitting: Nurse Practitioner

## 2013-07-27 ENCOUNTER — Other Ambulatory Visit: Payer: Self-pay | Admitting: *Deleted

## 2013-07-27 VITALS — BP 150/74 | HR 68 | Ht 63.0 in | Wt 210.8 lb

## 2013-07-27 DIAGNOSIS — Z79899 Other long term (current) drug therapy: Secondary | ICD-10-CM

## 2013-07-27 DIAGNOSIS — I4891 Unspecified atrial fibrillation: Secondary | ICD-10-CM

## 2013-07-27 LAB — BASIC METABOLIC PANEL
BUN: 15 mg/dL (ref 6–23)
CO2: 27 mEq/L (ref 19–32)
Calcium: 9.5 mg/dL (ref 8.4–10.5)
Chloride: 105 mEq/L (ref 96–112)
Creatinine, Ser: 0.9 mg/dL (ref 0.4–1.2)
GFR: 62.36 mL/min (ref >60.00)
Glucose, Bld: 180 mg/dL — ABNORMAL HIGH (ref 70–99)
Potassium: 4.8 mEq/L (ref 3.5–5.1)
Sodium: 138 mEq/L (ref 135–145)

## 2013-07-27 LAB — CBC WITH DIFFERENTIAL/PLATELET
Basophils Absolute: 0 10*3/uL (ref 0.0–0.1)
Basophils Relative: 0.6 % (ref 0.0–3.0)
Eosinophils Absolute: 0.1 10*3/uL (ref 0.0–0.7)
Eosinophils Relative: 1.7 % (ref 0.0–5.0)
HCT: 37.2 % (ref 36.0–46.0)
Hemoglobin: 12.2 g/dL (ref 12.0–15.0)
Lymphocytes Relative: 23.4 % (ref 12.0–46.0)
Lymphs Abs: 1.7 10*3/uL (ref 0.7–4.0)
MCHC: 32.8 g/dL (ref 30.0–36.0)
MCV: 86.8 fl (ref 78.0–100.0)
Monocytes Absolute: 0.5 10*3/uL (ref 0.1–1.0)
Monocytes Relative: 6.6 % (ref 3.0–12.0)
Neutro Abs: 4.9 10*3/uL (ref 1.4–7.7)
Neutrophils Relative %: 67.7 % (ref 43.0–77.0)
Platelets: 287 10*3/uL (ref 150.0–400.0)
RBC: 4.28 Mil/uL (ref 3.87–5.11)
RDW: 15.6 % — ABNORMAL HIGH (ref 11.5–14.6)
WBC: 7.2 10*3/uL (ref 4.5–10.5)

## 2013-07-27 NOTE — Addendum Note (Signed)
Addended by: Awilda BillARDEN, Ailine Hefferan J on: 07/27/2013 11:55 AM   Modules accepted: Orders

## 2013-07-27 NOTE — Patient Instructions (Signed)
I think you are doing well  Stay on your current medicines  We will check labs today  Reschedule visit with Dr. Johney FrameAllred  Call the [facility] office at [phone] if you have any questions, problems or concerns.

## 2013-07-27 NOTE — Progress Notes (Signed)
Sherry Vargas Date of Birth: April 26, 1941 Medical Record #[id number]  History of Present Illness: Sherry Vargas is seen back today for a follow up visit. Seen for Dr. Johney Frame. She is a 73 year old female with a history of HLD, HTN, dermatomyositis, PAD with prior right leg femoral bypass at [facility], mild memory issues, HTN, DM, prior stroke, diastolic HF and atrial fib. Was cardioverted back in November - reverted back to AF and then cardioverted in December after loading with amiodarone. She remains on anticoagulation with Eliquis.  Seen a month ago - medicines were reduced. She was in sinus rhythm.  Comes back today. Here alone. Doing ok. No chest pain. Not short of breath. Not dizzy. Energy ok. Doing everything that she wants to be doing. Feels like that she is out of rhythm. Notes that her heart rates were in the 50s but after January 3rd - have been a little higher. She is worried that she may be back out of rhythm. BP is great at home. Remains on her Eliquis. No bleeding/bruising.   Current Outpatient Prescriptions  Medication Sig Dispense Refill  . amiodarone (PACERONE) 200 MG tablet Take 1 tablet (200 mg total) by mouth daily.  90 tablet  3  . apixaban (ELIQUIS) 5 MG TABS tablet Take 1 tablet (5 mg total) by mouth 2 (two) times daily.  60 tablet  11  . furosemide (LASIX) 40 MG tablet Take 40 mg by mouth daily.      [name] glipiZIDE (GLUCOTROL) 5 MG tablet Take 0.5 tablets (2.5 mg total) by mouth 2 (two) times daily.  90 tablet  3  . IRON PO Take 325 mg by mouth daily.       . metFORMIN (GLUCOPHAGE) 500 MG tablet Take 2 tablets (1,000 mg total) by mouth 2 (two) times daily with a meal.  360 tablet  3  . metoprolol succinate (TOPROL-XL) 100 MG 24 hr tablet Take 1 tablet (100 mg total) by mouth daily.  90 tablet  3  . Multiple Vitamins-Minerals (ICAPS PO) Take 1 tablet by mouth daily.      . pantoprazole (PROTONIX) 40 MG tablet Take 1 tablet (40 mg total) by mouth daily.  90 tablet  3  .  promethazine (PHENERGAN) 25 MG tablet Take 0.5-1 tablets (12.5-25 mg total) by mouth every 8 (eight) hours as needed for nausea or vomiting.  30 tablet  0  . rosuvastatin (CRESTOR) 10 MG tablet Take 1 tablet (10 mg total) by mouth daily.  90 tablet  3   No current facility-administered medications for this visit.    No Known Allergies  Past Medical History  Diagnosis Date  . HLD (hyperlipidemia)   . DM (dermatomyositis)   . PAD (peripheral artery disease)   . 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 Paroxysmal a-fib     post operative following fem bypass at [facility] 12/11/10  . Hypertension   . Diabetes mellitus without complication   . Stroke     Past Surgical History  Procedure Laterality Date  . Adenoidectomy    . Breast lumpectomy      benign  . Knee cartilage surgery    . Intraoperative arteriogram      for claudication in L leg 2004 at [facility]  . Endovascular stents      R leg at [facility]  . Femoral bypass  12/11/10    R leg at [facility]  . Cardioversion N/A 04/27/2013    Procedure: CARDIOVERSION;  Surgeon:  Wendall StadePeter C Nishan, MD;  [facility];  Service: Cardiovascular;  Laterality: N/A;  . Cardioversion N/A 05/24/2013    Procedure: CARDIOVERSION;  Surgeon: Pricilla RifflePaula V Ross, MD;  [facility];  Service: Cardiovascular;  Laterality: N/A;  . Cardioversion N/A 05/31/2013    Procedure: CARDIOVERSION;  Surgeon: Hillis RangeJames Allred, MD;  [facility];  Service: Cardiovascular;  Laterality: N/A;    History  Smoking status  . Former Smoker  Smokeless tobacco  . Not on file    Comment: quit 2005    History  Alcohol Use  . Yes    Comment: occasionally    Family History  Problem Relation Age of Onset  . Lung cancer Mother   . Diabetes Father     a fib also    Review of Systems: The review of systems is per the HPI.  All other systems were reviewed and are negative.  Physical Exam: BP 150/74  Pulse 68  Ht 5\' 3"  (1.6 m)  Wt 210 lb 12.8 oz (95.618 kg)   BMI 37.35 kg/m2 Patient is very pleasant and in no acute distress. She is obese. Skin is warm and dry. Color is normal.  HEENT is unremarkable. Normocephalic/atraumatic. PERRL. Sclera are nonicteric. Neck is supple. No masses. No JVD. Lungs are clear. Cardiac exam shows a regular rate and rhythm. Abdomen is soft. Extremities are without edema. Gait and ROM are intact. No gross neurologic deficits noted.  LABORATORY DATA:  EKG today shows sinus rhythm with a rate of 67.    Lab Results  Component Value Date   WBC 12.6* 06/09/2013   HGB 13.2 06/09/2013   HCT 40.1 06/09/2013   PLT 668.0* 06/09/2013   GLUCOSE 85 06/09/2013   CHOL 118 03/19/2013   TRIG 93 03/19/2013   HDL 48 03/19/2013   LDLCALC 51 03/19/2013   ALT 14 06/09/2013   AST 19 06/09/2013   NA 136 06/09/2013   K 3.7 06/09/2013   CL 93* 06/09/2013   CREATININE 1.8* 06/09/2013   BUN 39* 06/09/2013   CO2 30 06/09/2013   TSH 0.892 06/01/2013   INR 1.29 05/26/2013   HGBA1C 6.5* 03/19/2013   Echo Study Conclusions from October 2014  - Left ventricle: The cavity size was normal. Wall thickness was increased in a pattern of mild LVH. The estimated ejection fraction was 60%. Regional wall motion abnormalities cannot be excluded. - Aortic valve: Sclerosis without stenosis. No significant regurgitation. - Mitral valve: Mild regurgitation. - Left atrium: The atrium was moderately dilated. - Pulmonary arteries: PA peak pressure: 31mm Hg (S).    Assessment / Plan: 1. PAF - s/p cardioversion - on amiodarone and remains on anticoagulation - she continues to hold in sinus. Recheck baseline labs today. No change in her medicines. HR is a little faster with reduction of her medicines..   2. Diastolic HF - looks compensated with no symptoms reported.   3. HTN - BP better at home - I have left her on her current regimen.   4. CKD - recheck her labs today  She is felt to be doing well overall. See her back in a month - she apparently  needs to reschedule visit with Dr. Johney FrameAllred - recheck her labs today as well.   Patient is agreeable to this plan and will call if any problems develop in the interim.   Rosalio MacadamiaLori C. Felise Georgia, RN, ANP-C [facility] [address] [place], [place]  [id number] [phone]

## 2013-07-28 ENCOUNTER — Telehealth: Payer: Self-pay | Admitting: Nurse Practitioner

## 2013-07-28 NOTE — Telephone Encounter (Signed)
New problem ° ° °Pt returning a call from nurse °

## 2013-07-28 NOTE — Telephone Encounter (Signed)
Pt aware of lab results 

## 2013-08-03 NOTE — Op Note (Signed)
Patient anesthtized by anesthesia.  With pads in AP position patient cardioverted to SR with 200 J synchronized biphasic energy.

## 2013-08-06 ENCOUNTER — Other Ambulatory Visit: Payer: Self-pay

## 2013-08-06 DIAGNOSIS — I4891 Unspecified atrial fibrillation: Secondary | ICD-10-CM

## 2013-08-06 MED ORDER — APIXABAN 5 MG PO TABS
5.0000 mg | ORAL_TABLET | Freq: Two times a day (BID) | ORAL | Status: DC
Start: 2013-08-06 — End: 2014-03-04

## 2013-08-23 ENCOUNTER — Ambulatory Visit: Payer: Medicare Other | Admitting: Internal Medicine

## 2013-09-06 ENCOUNTER — Ambulatory Visit (INDEPENDENT_AMBULATORY_CARE_PROVIDER_SITE_OTHER): Payer: Medicare Other | Admitting: Internal Medicine

## 2013-09-06 ENCOUNTER — Encounter (INDEPENDENT_AMBULATORY_CARE_PROVIDER_SITE_OTHER): Payer: Self-pay

## 2013-09-06 ENCOUNTER — Encounter: Payer: Self-pay | Admitting: Internal Medicine

## 2013-09-06 VITALS — BP 110/60 | HR 79 | Ht 63.0 in | Wt 198.4 lb

## 2013-09-06 DIAGNOSIS — I4891 Unspecified atrial fibrillation: Secondary | ICD-10-CM

## 2013-09-06 DIAGNOSIS — R5383 Other fatigue: Secondary | ICD-10-CM

## 2013-09-06 DIAGNOSIS — R5381 Other malaise: Secondary | ICD-10-CM

## 2013-09-06 MED ORDER — METOPROLOL SUCCINATE ER 50 MG PO TB24: 50.0000 mg | ORAL_TABLET | Freq: Every day | ORAL | Status: DC

## 2013-09-06 NOTE — Patient Instructions (Signed)
Your physician recommends that you schedule a follow-up appointment in: 3 months with Sherry Fredrickson, NP and 6 months with Dr Johney Frame  Your physician has recommended you make the following change in your medication:  1) Decrease Toprol to 50mg  daily        2 Gram Low Sodium Diet A 2 gram sodium diet restricts the amount of sodium in the diet to no more than 2 g or 2000 mg daily. Limiting the amount of sodium is often used to help lower blood pressure. It is important if you have heart, liver, or kidney problems. Many foods contain sodium for flavor and sometimes as a preservative. When the amount of sodium in a diet needs to be low, it is important to know what to look for when choosing foods and drinks. The following includes some information and guidelines to help make it easier for you to adapt to a low sodium diet. QUICK TIPS  Do not add salt to food.  Avoid convenience items and fast food.  Choose unsalted snack foods.  Buy lower sodium products, often labeled as "lower sodium" or "no salt added."  Check food labels to learn how much sodium is in 1 serving.  When eating at a restaurant, ask that your food be prepared with less salt or none, if possible. READING FOOD LABELS FOR SODIUM INFORMATION The nutrition facts label is a good place to find how much sodium is in foods. Look for products with no more than 500 to 600 mg of sodium per meal and no more than 150 mg per serving. Remember that 2 g = 2000 mg. The food label may also list foods as:  Sodium-free: Less than 5 mg in a serving.  Very low sodium: 35 mg or less in a serving.  Low-sodium: 140 mg or less in a serving.  Light in sodium: 50% less sodium in a serving. For example, if a food that usually has 300 mg of sodium is changed to become light in sodium, it will have 150 mg of sodium.  Reduced sodium: 25% less sodium in a serving. For example, if a food that usually has 400 mg of sodium is changed to reduced sodium,  it will have 300 mg of sodium. CHOOSING FOODS Grains  Avoid: Salted crackers and snack items. Some cereals, including instant hot cereals. Bread stuffing and biscuit mixes. Seasoned rice or pasta mixes.  Choose: Unsalted snack items. Low-sodium cereals, oats, puffed wheat and rice, shredded wheat. English muffins and bread. Pasta. Meats  Avoid: Salted, canned, smoked, spiced, pickled meats, including fish and poultry. Bacon, ham, sausage, cold cuts, hot dogs, anchovies.  Choose: Low-sodium canned tuna and salmon. Fresh or frozen meat, poultry, and fish. Dairy  Avoid: Processed cheese and spreads. Cottage cheese. Buttermilk and condensed milk. Regular cheese.  Choose: Milk. Low-sodium cottage cheese. Yogurt. Sour cream. Low-sodium cheese. Fruits and Vegetables  Avoid: Regular canned vegetables. Regular canned tomato sauce and paste. Frozen vegetables in sauces. Olives. Rosita Fire. Relishes. Sauerkraut.  Choose: Low-sodium canned vegetables. Low-sodium tomato sauce and paste. Frozen or fresh vegetables. Fresh and frozen fruit. Condiments  Avoid: Canned and packaged gravies. Worcestershire sauce. Tartar sauce. Barbecue sauce. Soy sauce. Steak sauce. Ketchup. Onion, garlic, and table salt. Meat flavorings and tenderizers.  Choose: Fresh and dried herbs and spices. Low-sodium varieties of mustard and ketchup. Lemon juice. Tabasco sauce. Horseradish. SAMPLE 2 GRAM SODIUM MEAL PLAN Breakfast / Sodium (mg)  1 cup low-fat milk / 143 mg  2 slices whole-wheat toast /  270 mg  1 tbs heart-healthy margarine / 153 mg  1 hard-boiled egg / 139 mg  1 small orange / 0 mg Lunch / Sodium (mg)  1 cup raw carrots / 76 mg   cup hummus / 298 mg  1 cup low-fat milk / 143 mg   cup red grapes / 2 mg  1 whole-wheat pita bread / 356 mg Dinner / Sodium (mg)  1 cup whole-wheat pasta / 2 mg  1 cup low-sodium tomato sauce / 73 mg  3 oz lean ground beef / 57 mg  1 small side salad (1 cup raw  spinach leaves,  cup cucumber,  cup yellow bell pepper) with 1 tsp olive oil and 1 tsp red wine vinegar / 25 mg Snack / Sodium (mg)  1 container low-fat vanilla yogurt / 107 mg  3 graham cracker squares / 127 mg Nutrient Analysis  Calories: 2033  Protein: 77 g  Carbohydrate: 282 g  Fat: 72 g  Sodium: 1971 mg Document Released: 06/17/2005 Document Revised: 09/09/2011 Document Reviewed: 09/18/2009 ExitCare Patient Information 2014 [employer] [place].

## 2013-09-06 NOTE — Progress Notes (Signed)
PCP:  Leo Grosser, MD  The patient presents today for routine electrophysiology followup. She remains in sinus and is doing well.  She had increased sodium intake this weekend with minor increase in edema since.  She has some fatigue.  All in all, her daughter thinks that she is doing "fabulous".  Today, she denies symptoms of palpitations, chest pain, shortness of breath, orthopnea, PND,   dizziness, presyncope, syncope, or neurologic sequela.  The patient feels that she is tolerating medications without difficulties and is otherwise without complaint today.   Past Medical History  Diagnosis Date  . HLD (hyperlipidemia)   . DM (dermatomyositis)   . PAD (peripheral artery disease)   . 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 Paroxysmal a-fib     post operative following fem bypass at [facility] 12/11/10  . Hypertension   . Diabetes mellitus without complication   . Stroke    Past Surgical History  Procedure Laterality Date  . Adenoidectomy    . Breast lumpectomy      benign  . Knee cartilage surgery    . Intraoperative arteriogram      for claudication in L leg 2004 at [facility]  . Endovascular stents      R leg at [facility]  . Femoral bypass  12/11/10    R leg at [facility]  . Cardioversion N/A 04/27/2013    Procedure: CARDIOVERSION;  Surgeon: Wendall Stade, MD;  Location: [facility] ENDOSCOPY;  Service: Cardiovascular;  Laterality: N/A;  . Cardioversion N/A 05/24/2013    Procedure: CARDIOVERSION;  Surgeon: Pricilla Riffle, MD;  Location: [facility] ENDOSCOPY;  Service: Cardiovascular;  Laterality: N/A;  . Cardioversion N/A 05/31/2013    Procedure: CARDIOVERSION;  Surgeon: Hillis Range, MD;  Location: [facility] OR;  Service: Cardiovascular;  Laterality: N/A;    Current Outpatient Prescriptions  Medication Sig Dispense Refill  . amiodarone (PACERONE) 200 MG tablet Take 1 tablet (200 mg total) by mouth daily.  90 tablet  3  . apixaban (ELIQUIS) 5 MG TABS tablet Take 1 tablet (5 mg total) by mouth 2  (two) times daily.  180 tablet  1  . furosemide (LASIX) 40 MG tablet Take 40 mg by mouth daily.      [name] glipiZIDE (GLUCOTROL) 5 MG tablet Take 0.5 tablets (2.5 mg total) by mouth 2 (two) times daily.  90 tablet  3  . IRON PO Take 325 mg by mouth daily.       . metFORMIN (GLUCOPHAGE) 500 MG tablet Take 2 tablets (1,000 mg total) by mouth 2 (two) times daily with a meal.  360 tablet  3  . metoprolol succinate (TOPROL-XL) 100 MG 24 hr tablet Take 1 tablet (100 mg total) by mouth daily.  90 tablet  3  . Multiple Vitamins-Minerals (ICAPS PO) Take 1 tablet by mouth daily.      . pantoprazole (PROTONIX) 40 MG tablet Take 1 tablet (40 mg total) by mouth daily.  90 tablet  3  . promethazine (PHENERGAN) 25 MG tablet Take 0.5-1 tablets (12.5-25 mg total) by mouth every 8 (eight) hours as needed for nausea or vomiting.  30 tablet  0  . rosuvastatin (CRESTOR) 10 MG tablet Take 1 tablet (10 mg total) by mouth daily.  90 tablet  3   No current facility-administered medications for this visit.    No Known Allergies  History   Social History  . Marital Status: Single    Spouse Name: N/A    Number of  Children: N/A  . Years of Education: N/A   Occupational History  . Not on file.   Social History Main Topics  . Smoking status: Former Games developermoker  . Smokeless tobacco: Not on file     Comment: quit 2005  . Alcohol Use: Yes     Comment: occasionally  . Drug Use: No  . Sexual Activity: Not on file   Other Topics Concern  . Not on file   Social History Narrative   [place]   Retired Product/process development scientistcomputer tech support    Family History  Problem Relation Age of Onset  . Lung cancer Mother   . Diabetes Father     a fib also    Physical Exam: Filed Vitals:   09/06/13 1112  BP: 110/60  Pulse: 79  Height: 5\' 3"  (1.6 m)  Weight: 198 lb 6.4 oz (89.994 kg)    GEN- The patient is well appearing, alert and oriented x 3 today.   Head- normocephalic, atraumatic Eyes-  Sclera  clear, conjunctiva pink Ears- hearing intact Oropharynx- clear Neck- supple, no JVP Lymph- no cervical lymphadenopathy Lungs- Clear to ausculation bilaterally, normal work of breathing Heart- Regular rate and rhythm, no murmurs, rubs or gallops, PMI not laterally displaced GI- soft, NT, ND, + BS Extremities- no clubbing, cyanosis, trace edema  ekg today reveals sinus rhythm 79 bpm, otherwise normal ekg  Assessment and Plan:  1. afib Continue amiodarone to 200mg  daily Decrease metoprolol to 50 mg daily Continue eliquis  2. Chronic diastolic dysfunction Clinically much improved 2 gram sodium restriction advised  3. Acute renal failure Resolved with reduction in lasix back to baseline dose  Return to see Lawson FiscalLori in 3 months I will see in 6 months

## 2013-09-06 NOTE — Addendum Note (Signed)
Addended by: Dennis BastLANIER, KELLY F on: 09/06/2013 11:43 AM   Modules accepted: Orders

## 2013-11-03 ENCOUNTER — Telehealth: Payer: Self-pay | Admitting: *Deleted

## 2013-11-03 NOTE — Telephone Encounter (Signed)
S/w pt aware of resechduling Kary KosLori Gerhardt's appointment 6/24

## 2013-11-09 ENCOUNTER — Encounter: Payer: Self-pay | Admitting: Internal Medicine

## 2013-12-17 ENCOUNTER — Ambulatory Visit: Payer: Medicare Other | Admitting: Nurse Practitioner

## 2013-12-22 ENCOUNTER — Ambulatory Visit (INDEPENDENT_AMBULATORY_CARE_PROVIDER_SITE_OTHER): Payer: Medicare Other | Admitting: Nurse Practitioner

## 2013-12-22 ENCOUNTER — Encounter: Payer: Self-pay | Admitting: Nurse Practitioner

## 2013-12-22 ENCOUNTER — Other Ambulatory Visit: Payer: Self-pay | Admitting: *Deleted

## 2013-12-22 VITALS — BP 110/80 | HR 77 | Ht 63.0 in | Wt 187.8 lb

## 2013-12-22 DIAGNOSIS — I10 Essential (primary) hypertension: Secondary | ICD-10-CM

## 2013-12-22 DIAGNOSIS — I480 Paroxysmal atrial fibrillation: Secondary | ICD-10-CM

## 2013-12-22 DIAGNOSIS — I4891 Unspecified atrial fibrillation: Secondary | ICD-10-CM

## 2013-12-22 DIAGNOSIS — Z79899 Other long term (current) drug therapy: Secondary | ICD-10-CM

## 2013-12-22 MED ORDER — FUROSEMIDE 40 MG PO TABS: 40.0000 mg | ORAL_TABLET | Freq: Every day | ORAL | Status: DC

## 2013-12-22 MED ORDER — AMIODARONE HCL 200 MG PO TABS: 200.0000 mg | ORAL_TABLET | Freq: Every day | ORAL | Status: DC

## 2013-12-22 NOTE — Patient Instructions (Addendum)
Stay on your current medicines and get back on your amiodarone at 200 mg each day  See Dr. Johney FrameAllred in September  Call the [facility] office at [phone] if you have any questions, problems or concerns.

## 2013-12-22 NOTE — Progress Notes (Signed)
Nyoka Lintarol A Mirsky Date of Birth: 22-May-1941 Medical Record #[id number]  History of Present Illness: Ms. Germaine PomfretKemmler is seen back today for a follow up visit. Seen for Dr. Johney FrameAllred. This is a 3 month check. She is a 73 year old female with a history of HLD, HTN, dermatomyositis, PAD with [facility], mild memory issues, HTN, DM, prior stroke, diastolic HF and atrial fib. Was cardioverted back in November of 2014 - reverted back to AF and then cardioverted in December after loading with amiodarone. She remains on anticoagulation with Eliquis.   Seen back in March by Dr. Johney FrameAllred. Was doing ok. Some fatigue but overall well.   Comes back today. Here alone. Doing ok. No chest pain. Not short of breath. Not dizzy. Energy ok. Doing everything that she wants to be doing. She is pretty pleased with how she feels. She is no longer taking her amiodarone - says it was stopped but said she may have misunderstood her instructions at last visit. Rhythm ok as far as she knows. BP good. Actively losing weight.  Current Outpatient Prescriptions  Medication Sig Dispense Refill  . apixaban (ELIQUIS) 5 MG TABS tablet Take 1 tablet (5 mg total) by mouth 2 (two) times daily.  180 tablet  1  . furosemide (LASIX) 40 MG tablet Take 40 mg by mouth daily.      [name]. glipiZIDE (GLUCOTROL) 5 MG tablet Take 0.5 tablets (2.5 mg total) by mouth 2 (two) times daily.  90 tablet  3  . IRON PO Take 325 mg by mouth daily.       . metFORMIN (GLUCOPHAGE) 500 MG tablet Take 2 tablets (1,000 mg total) by mouth 2 (two) times daily with a meal.  360 tablet  3  . metoprolol succinate (TOPROL-XL) 50 MG 24 hr tablet Take 1 tablet (50 mg total) by mouth daily.  90 tablet  3  . Multiple Vitamins-Minerals (ICAPS PO) Take 1 tablet by mouth daily.      . rosuvastatin (CRESTOR) 10 MG tablet Take 1 tablet (10 mg total) by mouth daily.  90 tablet  3   No current facility-administered medications for this visit.    No Known  Allergies  Past Medical History  Diagnosis Date  . HLD (hyperlipidemia)   . DM (dermatomyositis)   . PAD (peripheral artery disease)   . 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 Paroxysmal a-fib     [facility]  . Hypertension   . Diabetes mellitus without complication   . Stroke     Past Surgical History  Procedure Laterality Date  . Adenoidectomy    . Breast lumpectomy      benign  . Knee cartilage surgery    . Intraoperative arteriogram      [facility]  . Endovascular stents      [facility]  . Femoral bypass  12/11/10    [facility]  . Cardioversion N/A 04/27/2013    Procedure: CARDIOVERSION;  Surgeon: Wendall StadePeter C Nishan, MD;  [facility];  Service: Cardiovascular;  Laterality: N/A;  . Cardioversion N/A 05/24/2013    Procedure: CARDIOVERSION;  Surgeon: Pricilla RifflePaula V Ross, MD;  [facility];  Service: Cardiovascular;  Laterality: N/A;  . Cardioversion N/A 05/31/2013    Procedure: CARDIOVERSION;  Surgeon: Hillis RangeJames Allred, MD;  [facility];  Service: Cardiovascular;  Laterality: N/A;  History  Smoking status  . Former Smoker  Smokeless tobacco  . Not on file    Comment: quit 2005    History  Alcohol Use  . Yes    Comment: occasionally    Family History  Problem Relation Age of Onset  . Lung cancer Mother   . Diabetes Father     a fib also    Review of Systems: The review of systems is per the HPI.  All other systems were reviewed and are negative.  Physical Exam: BP 110/80  Pulse 77  Ht 5\' 3"  (1.6 m)  Wt 187 lb 12.8 oz (85.186 kg)  BMI 33.28 kg/m2  SpO2 95% Patient is very pleasant and in no acute distress. Losing weight. Skin is warm and dry. Color is normal.  HEENT is unremarkable but with poor teeth. Normocephalic/atraumatic. PERRL. Sclera are nonicteric. Neck is supple. No masses. No JVD. Lungs are clear. Cardiac exam shows a regular rate and rhythm.  Soft outflow murmur. Abdomen is soft. Extremities are without edema. Gait and ROM are intact. No gross neurologic deficits noted.  Wt Readings from Last 3 Encounters:  12/22/13 187 lb 12.8 oz (85.186 kg)  09/06/13 198 lb 6.4 oz (89.994 kg)  07/27/13 210 lb 12.8 oz (95.618 kg)    LABORATORY DATA:  Lab Results  Component Value Date   WBC 7.2 07/27/2013   HGB 12.2 07/27/2013   HCT 37.2 07/27/2013   PLT 287.0 07/27/2013   GLUCOSE 180* 07/27/2013   CHOL 118 03/19/2013   TRIG 93 03/19/2013   HDL 48 03/19/2013   LDLCALC 51 03/19/2013   ALT 14 06/09/2013   AST 19 06/09/2013   NA 138 07/27/2013   K 4.8 07/27/2013   CL 105 07/27/2013   CREATININE 0.9 07/27/2013   BUN 15 07/27/2013   CO2 27 07/27/2013   TSH 0.892 06/01/2013   INR 1.29 05/26/2013   HGBA1C 6.5* 03/19/2013   Echo Study Conclusions from October 2014  - Left ventricle: The cavity size was normal. Wall thickness was increased in a pattern of mild LVH. The estimated ejection fraction was 60%. Regional wall motion abnormalities cannot be excluded. - Aortic valve: Sclerosis without stenosis. No significant regurgitation. - Mitral valve: Mild regurgitation. - Left atrium: The atrium was moderately dilated. - Pulmonary arteries: PA peak pressure: 31mm Hg (S).   BNP (last 3 results)  Recent Labs  03/19/13 1123 05/26/13 0115 05/26/13 1550  PROBNP 1513.00* 1097.0* 5445.0*     Assessment / Plan:  1. PAF - s/p cardioversion last November and December - she is in sinus by exam today - will restart her amiodarone and continue her on anticoagulation. She will be having labs with her physical next week.    2. Diastolic HF - looks compensated with no symptoms reported.   3. HTN - BP better at home - I have left her on her current regimen.   4. CKD   5. Obesity - actively losing weight - she is congratulated.  She is felt to be doing well overall. See her back as planned.   Patient is agreeable to this plan and will call if any  problems develop in the interim.   Rosalio MacadamiaLori C. Gerhardt, RN, ANP-C [facility] [address] [place], [place]  [id number] [phone]

## 2014-03-04 ENCOUNTER — Other Ambulatory Visit: Payer: Self-pay | Admitting: *Deleted

## 2014-03-04 MED ORDER — APIXABAN 5 MG PO TABS: 5.0000 mg | ORAL_TABLET | Freq: Two times a day (BID) | ORAL | Status: DC

## 2014-03-21 ENCOUNTER — Ambulatory Visit (INDEPENDENT_AMBULATORY_CARE_PROVIDER_SITE_OTHER): Payer: Medicare Other | Admitting: Internal Medicine

## 2014-03-21 ENCOUNTER — Encounter: Payer: Self-pay | Admitting: Internal Medicine

## 2014-03-21 VITALS — BP 118/62 | HR 56 | Ht 63.0 in | Wt 195.0 lb

## 2014-03-21 DIAGNOSIS — I5032 Chronic diastolic (congestive) heart failure: Secondary | ICD-10-CM

## 2014-03-21 DIAGNOSIS — I4891 Unspecified atrial fibrillation: Secondary | ICD-10-CM

## 2014-03-21 DIAGNOSIS — I509 Heart failure, unspecified: Secondary | ICD-10-CM

## 2014-03-21 DIAGNOSIS — I480 Paroxysmal atrial fibrillation: Secondary | ICD-10-CM

## 2014-03-21 LAB — BASIC METABOLIC PANEL
BUN: 19 mg/dL (ref 6–23)
CALCIUM: 9 mg/dL (ref 8.4–10.5)
CO2: 28 meq/L (ref 19–32)
CREATININE: 1 mg/dL (ref 0.4–1.2)
Chloride: 102 mEq/L (ref 96–112)
GFR: 60.03 mL/min (ref >60.00)
GLUCOSE: 102 mg/dL — AB (ref 70–99)
Potassium: 4.1 mEq/L (ref 3.5–5.1)
Sodium: 141 mEq/L (ref 135–145)

## 2014-03-21 LAB — CBC WITH DIFFERENTIAL/PLATELET
BASOS PCT: 0.5 % (ref 0.0–3.0)
Basophils Absolute: 0 10*3/uL (ref 0.0–0.1)
EOS PCT: 2.2 % (ref 0.0–5.0)
Eosinophils Absolute: 0.2 10*3/uL (ref 0.0–0.7)
HEMATOCRIT: 36 % (ref 36.0–46.0)
HEMOGLOBIN: 11.9 g/dL — AB (ref 12.0–15.0)
Lymphocytes Relative: 32.8 % (ref 12.0–46.0)
Lymphs Abs: 2.4 10*3/uL (ref 0.7–4.0)
MCHC: 33.1 g/dL (ref 30.0–36.0)
MCV: 88.1 fl (ref 78.0–100.0)
MONOS PCT: 7.7 % (ref 3.0–12.0)
Monocytes Absolute: 0.6 10*3/uL (ref 0.1–1.0)
NEUTROS ABS: 4.1 10*3/uL (ref 1.4–7.7)
Neutrophils Relative %: 56.8 % (ref 43.0–77.0)
Platelets: 251 10*3/uL (ref 150.0–400.0)
RBC: 4.09 Mil/uL (ref 3.87–5.11)
RDW: 14.3 % (ref 11.5–15.5)
WBC: 7.2 10*3/uL (ref 4.0–10.5)

## 2014-03-21 LAB — TSH: TSH: 4.29 u[IU]/mL (ref 0.35–4.50)

## 2014-03-21 LAB — HEPATIC FUNCTION PANEL
ALT: 19 U/L (ref 0–35)
AST: 18 U/L (ref 0–37)
Albumin: 3.9 g/dL (ref 3.5–5.2)
Alkaline Phosphatase: 82 U/L (ref 39–117)
Bilirubin, Direct: 0.1 mg/dL (ref 0.0–0.3)
TOTAL PROTEIN: 7.4 g/dL (ref 6.0–8.3)
Total Bilirubin: 0.6 mg/dL (ref 0.2–1.2)

## 2014-03-21 MED ORDER — AMIODARONE HCL 200 MG PO TABS: 200.0000 mg | ORAL_TABLET | Freq: Every day | ORAL | Status: DC

## 2014-03-21 MED ORDER — APIXABAN 5 MG PO TABS: 5.0000 mg | ORAL_TABLET | Freq: Two times a day (BID) | ORAL | Status: DC

## 2014-03-21 NOTE — Patient Instructions (Signed)
Your physician recommends that you schedule a follow-up appointment in: 3 months with Rudi Coco, NP and 6 months with Dr. Johney Frame   Your physician recommends that you return for lab work today: CBC/BMP/LIVER/TSH

## 2014-03-21 NOTE — Progress Notes (Signed)
PCP:  Leo Grosser, MD  The patient presents today for routine electrophysiology followup. She remains in sinus and is doing well.   No symptoms of afib.  Today, she denies symptoms of palpitations, chest pain, shortness of breath, orthopnea, PND,   dizziness, presyncope, syncope, or neurologic sequela.  The patient feels that she is tolerating medications without difficulties and is otherwise without complaint today.   Past Medical History  Diagnosis Date  . HLD (hyperlipidemia)   . DM (dermatomyositis)   . PAD (peripheral artery disease)   . 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 Paroxysmal a-fib     post operative following fem bypass at [facility] 12/11/10  . Hypertension   . Diabetes mellitus without complication   . Stroke    Past Surgical History  Procedure Laterality Date  . Adenoidectomy    . Breast lumpectomy      benign  . Knee cartilage surgery    . Intraoperative arteriogram      for claudication in L leg 2004 at [facility]  . Endovascular stents      R leg at [facility]  . Femoral bypass  12/11/10    R leg at [facility]  . Cardioversion N/A 04/27/2013    Procedure: CARDIOVERSION;  Surgeon: Wendall Stade, MD;  Location: [facility] ENDOSCOPY;  Service: Cardiovascular;  Laterality: N/A;  . Cardioversion N/A 05/24/2013    Procedure: CARDIOVERSION;  Surgeon: Pricilla Riffle, MD;  Location: [facility] ENDOSCOPY;  Service: Cardiovascular;  Laterality: N/A;  . Cardioversion N/A 05/31/2013    Procedure: CARDIOVERSION;  Surgeon: Hillis Range, MD;  Location: [facility] OR;  Service: Cardiovascular;  Laterality: N/A;    Current Outpatient Prescriptions  Medication Sig Dispense Refill  . amiodarone (PACERONE) 200 MG tablet Take 1 tablet (200 mg total) by mouth daily.  90 tablet  3  . apixaban (ELIQUIS) 5 MG TABS tablet Take 1 tablet (5 mg total) by mouth 2 (two) times daily.  180 tablet  3  . furosemide (LASIX) 40 MG tablet Take 1 tablet (40 mg total) by mouth daily.  90 tablet  3  . IRON PO Take 325  mg by mouth daily.       [name] LEVEMIR FLEXTOUCH 100 UNIT/ML Pen Inject 50 Units into the skin daily.      . metFORMIN (GLUCOPHAGE) 500 MG tablet Take 2 tablets (1,000 mg total) by mouth 2 (two) times daily with a meal.  360 tablet  3  . metoprolol succinate (TOPROL-XL) 50 MG 24 hr tablet Take 1 tablet (50 mg total) by mouth daily.  90 tablet  3  . Multiple Vitamins-Minerals (ICAPS PO) Take 1 tablet by mouth daily.      . rosuvastatin (CRESTOR) 10 MG tablet Take 1 tablet (10 mg total) by mouth daily.  90 tablet  3   No current facility-administered medications for this visit.    No Known Allergies  History   Social History  . Marital Status: Single    Spouse Name: N/A    Number of Children: N/A  . Years of Education: N/A   Occupational History  . Not on file.   Social History Main Topics  . Smoking status: Former Games developer  . Smokeless tobacco: Not on file     Comment: quit 2005  . Alcohol Use: Yes     Comment: occasionally  . Drug Use: No  . Sexual Activity: Not on file   Other Topics Concern  . Not on file  Social History Narrative   Lives in [place], near [place].   Retired Product/process development scientist    Family History  Problem Relation Age of Onset  . Lung cancer Mother   . Diabetes Father     a fib also    Physical Exam: Filed Vitals:   03/21/14 1441  BP: 118/62  Pulse: 56  Height:  (1.6 m)  Weight: 195 lb (88.451 kg)    GEN- The patient is well appearing, alert and oriented x 3 today.   Head- normocephalic, atraumatic Eyes-  Sclera clear, conjunctiva pink Ears- hearing intact Oropharynx- clear Neck- supple, no JVP Lymph- no cervical lymphadenopathy Lungs- Clear to ausculation bilaterally, normal work of breathing Heart- Regular rate and rhythm, no murmurs, rubs or gallops, PMI not laterally displaced GI- soft, NT, ND, + BS Extremities- no clubbing, cyanosis, trace edema  ekg today reveals sinus rhythm 56 bpm, otherwise normal  ekg  Assessment and Plan:  1. afib Continue amiodarone to  daily Continue eliquis Lfts/tfts/bmet/ cbc today  2. Chronic diastolic dysfunction Clinically much improved 2 gram sodium restriction advised    Return to see Lupita Leash in 3 months I will see in 6 months

## 2014-04-11 ENCOUNTER — Other Ambulatory Visit: Payer: Self-pay | Admitting: *Deleted

## 2014-04-11 MED ORDER — AMIODARONE HCL 200 MG PO TABS: 200.0000 mg | ORAL_TABLET | Freq: Every day | ORAL | Status: DC

## 2014-05-11 ENCOUNTER — Encounter: Payer: Self-pay | Admitting: Family

## 2014-05-12 ENCOUNTER — Ambulatory Visit (INDEPENDENT_AMBULATORY_CARE_PROVIDER_SITE_OTHER): Payer: Medicare Other | Admitting: Family

## 2014-05-12 ENCOUNTER — Encounter: Payer: Self-pay | Admitting: Family

## 2014-05-12 ENCOUNTER — Ambulatory Visit (HOSPITAL_COMMUNITY)
Admission: RE | Admit: 2014-05-12 | Discharge: 2014-05-12 | Disposition: A | Discharge status: Home / Self Care | Payer: Medicare Other | Source: Ambulatory Visit | Attending: Family | Admitting: Family

## 2014-05-12 ENCOUNTER — Ambulatory Visit (INDEPENDENT_AMBULATORY_CARE_PROVIDER_SITE_OTHER)
Admission: RE | Admit: 2014-05-12 | Discharge: 2014-05-12 | Disposition: A | Discharge status: Home / Self Care | Payer: Medicare Other | Source: Ambulatory Visit | Attending: Family | Admitting: Family

## 2014-05-12 ENCOUNTER — Other Ambulatory Visit: Payer: Self-pay | Admitting: Vascular Surgery

## 2014-05-12 ENCOUNTER — Other Ambulatory Visit (HOSPITAL_COMMUNITY): Payer: Medicare Other

## 2014-05-12 VITALS — BP 99/59 | HR 62 | Resp 16 | Ht 63.0 in | Wt 187.0 lb

## 2014-05-12 DIAGNOSIS — Z48812 Encounter for surgical aftercare following surgery on the circulatory system: Secondary | ICD-10-CM

## 2014-05-12 DIAGNOSIS — I739 Peripheral vascular disease, unspecified: Secondary | ICD-10-CM

## 2014-05-12 NOTE — Progress Notes (Signed)
[facility] [place] HISTORY AND PHYSICAL -PAD  History of Present Illness Sherry Vargas is a 73 y.o. female patient of Dr. Darrick PennaFields who presents for evaluation of PAD.  The patient has a history of a right femoral to below-knee popliteal bypass with vein [facility] in [facility]. This was done for claudication at 200 feet. She thinks she has also had angioplasties in her left leg in the past. She is on Plavix.  She denies claudication symptoms with walking, denies non healing wounds.  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 Other medical problems include atrial fibrillation which is followed by Dr. Johney FrameAllred. She currently has been limited in her exercise secondary to this. She is on medication for rate control as well as Eliquis for anticoagulation. She also has a history of diabetes and hyperlipidemia both of which are currently stable. 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  The patient reports New Medical or Surgical History: she was hospitalized November, 2014 for CHF, pulmonary edema, and a-fib. She reports having several cardioversionS and the last one converted her to NSR without relapsing into a-fib.  Pt Diabetic: Yes, states improved control from when she was hospitalized Nov, 2014 Pt smoker: former smoker, quit in the 1970's, then again in 2005  Pt meds include: Statin :Yes Betablocker: Yes ASA: No Other anticoagulants/antiplatelets: Eliquis  Past Medical History  Diagnosis Date  . HLD (hyperlipidemia)   . DM (dermatomyositis)   . PAD (peripheral artery disease)   . 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 Paroxysmal a-fib     post [facility]10  . Hypertension   . Diabetes mellitus without complication   . Stroke   .  CHF (congestive heart failure)     Social History History  Substance Use Topics  . Smoking status: Former Smoker    Quit date: 05/12/2004  . Smokeless tobacco: Never Used     Comment: quit 2005  . Alcohol Use: Yes     Comment: occasionally    Family History Family History  Problem Relation Age of Onset  . Lung cancer Mother   . Cancer Mother     Lung  . Diabetes Father     a fib also    Past Surgical History  Procedure Laterality Date  . Adenoidectomy    . Breast lumpectomy      benign  . Knee cartilage surgery    . Intraoperative arteriogram      [facility]  . Endovascular stents      [facility]  . Femoral bypass  12/11/10    [facility]  . Cardioversion N/A 04/27/2013    Procedure: CARDIOVERSION;  Surgeon: Wendall StadePeter C Nishan, MD;  [facility];  Service: Cardiovascular;  Laterality: N/A;  . Cardioversion N/A 05/24/2013    Procedure: CARDIOVERSION;  Surgeon: Pricilla RifflePaula V Ross, MD;  [facility];  Service: Cardiovascular;  Laterality: N/A;  . Cardioversion N/A 05/31/2013    Procedure: CARDIOVERSION;  Surgeon: Hillis RangeJames Allred, MD;  [facility];  Service: Cardiovascular;  Laterality: N/A;    No Known Allergies  Current Outpatient Prescriptions  Medication Sig Dispense Refill  . amiodarone (PACERONE) 200 MG  tablet Take 1 tablet (200 mg total) by mouth daily. 90 tablet 3  . apixaban (ELIQUIS) 5 MG TABS tablet Take 1 tablet (5 mg total) by mouth 2 (two) times daily. 180 tablet 3  . furosemide (LASIX) 40 MG tablet Take 1 tablet (40 mg total) by mouth daily. 90 tablet 3  . IRON PO Take 325 mg by mouth daily.     . metFORMIN (GLUCOPHAGE) 500 MG tablet Take 2 tablets (1,000 mg total) by mouth 2 (two) times daily with a meal. 360 tablet 3  . metoprolol succinate (TOPROL-XL) 50 MG 24 hr tablet Take 1 tablet (50 mg total) by mouth daily. 90 tablet 3  . Multiple Vitamins-Minerals (ICAPS PO) Take 1 tablet by mouth daily.    .  rosuvastatin (CRESTOR) 10 MG tablet Take 1 tablet (10 mg total) by mouth daily. 90 tablet 3  . VICTOZA 18 MG/3ML SOPN   2  . LEVEMIR FLEXTOUCH 100 UNIT/ML Pen Inject 50 Units into the skin daily.     No current facility-administered medications for this visit.    ROS: See HPI for pertinent positives and negatives.   Physical Examination  Filed Vitals:   05/12/14 1122  BP: 99/59  Pulse: 62  Resp: 16  Height: 5\' 3"  (1.6 m)  Weight: 187 lb (84.823 kg)  SpO2: 98%   Body mass index is 33.13 kg/(m^2).  General: A&O x 3, WDWN, obese female. Gait: normal Eyes: PERRLA. Pulmonary: CTAB, without wheezes , rales or rhonchi. Cardiac: regular Rythm , without detected murmur.         Carotid Bruits Right Left   Negative Negative  Aorta is not palpable. Radial pulses: 2+ palpable and =                           VASCULAR EXAM: Extremities without ischemic changes  without Gangrene; without open wounds.                                                                                                          LE Pulses Right Left       FEMORAL  1+ palpable  1+ palpable        POPLITEAL  not palpable   not palpable       POSTERIOR TIBIAL  not palpable   not palpable        DORSALIS PEDIS      ANTERIOR TIBIAL not palpable  1+ palpable    Abdomen: soft, NT, no palpated masses. Skin: no rashes, no ulcers noted. Musculoskeletal: no muscle wasting or atrophy.  Neurologic: A&O X 3; Appropriate Affect ; SENSATION: normal; MOTOR FUNCTION:  moving all extremities equally, motor strength 5/5 throughout. Speech is fluent/normal. CN 2-12 intact.    Non-Invasive Vascular Imaging: DATE: 05/12/2014 LOWER EXTREMITY ARTERIAL DUPLEX EVALUATION    INDICATION: Peripheral vascular disease    PREVIOUS INTERVENTION(S): Right femoral to popliteal artery bypass graft june 2012([facility]); angioplasty left  lower extremity ([place])01/2003.    DUPLEX EXAM:  RIGHT  LEFT   Peak  Systolic Velocity (cm/s) Ratio (if abnormal) Waveform  Peak Systolic Velocity (cm/s) Ratio (if abnormal) Waveform  146  T Inflow Artery     112  B Proximal Anastomosis     128  B Proximal Graft     72  B Mid Graft     68  B  Distal Graft     67  B Distal Anastomosis     43/77  B/B Outflow Artery     1.05/0.59 Today's ABI / TBI 1.08/0.54  1.08/0.70 Previous ABI / TBI (05/06/2013  ) 1.08/0.83    Waveform:    M - Monophasic       B - Biphasic       T - Triphasic  If Ankle Brachial Index (ABI) or Toe Brachial Index (TBI) performed, please see complete report  ADDITIONAL FINDINGS:     IMPRESSION: Patent right femoral-popliteal artery bypass graft, mild homogenous plaque present at the proximal anastomosis without hemodynamically significant stenosis present. Patent right arterial outflow with monophasic flow noted involving the right anterior tibial artery suggestive of disease.    Compared to the previous exam:  Stable ankle brachial indices since previous study on 05/06/2013.     ASSESSMENT: TAMISHA NORDSTROM is a 73 y.o. female who is s/p right femoral to popliteal artery bypass graft in June 2012([facility]); angioplasty left  lower extremity [facility])01/2003. Currently she has no claudication symptoms with walking and no tissue loss. Today's right LE arterial Duplex reveals a patent right femoral-popliteal artery bypass graft, mild homogenous plaque present at the proximal anastomosis without hemodynamically significant stenosis present. Patent right arterial outflow with monophasic flow noted involving the right anterior tibial artery suggestive of disease. Stable and normal ankle brachial indices since previous study on 05/06/2013, TBI's are abnormal.  PLAN:  Graduated walking program. I discussed in depth with the patient the nature of atherosclerosis, and emphasized the importance of maximal medical management including strict control of blood pressure, blood glucose, and  lipid levels, obtaining regular , and continued cessation of smoking.  The patient is aware that without maximal medical management the underlying atherosclerotic disease process will progress, limiting the benefit of anyexercise interventions.  Based on the patient's vascular studies and examination, pt will return to clinic in 1 year for ABI's and right LE arterial Duplex.  The patient was given information about PAD including signs, symptoms, treatment, what symptoms should prompt the patient to seek immediate medical care, and risk reduction measures to take.  Charisse March, RN, MSN, FNP-C [facility] [place] Phone: [phone]  Clinic MD: [facility]  05/12/2014 11:31 AM

## 2014-05-12 NOTE — Patient Instructions (Signed)

## 2014-06-21 ENCOUNTER — Ambulatory Visit (HOSPITAL_COMMUNITY)
Admission: RE | Admit: 2014-06-21 | Discharge: 2014-06-21 | Disposition: A | Discharge status: Home / Self Care | Payer: Medicare Other | Source: Ambulatory Visit | Attending: Nurse Practitioner | Admitting: Nurse Practitioner

## 2014-06-21 ENCOUNTER — Encounter (HOSPITAL_COMMUNITY): Payer: Self-pay | Admitting: Nurse Practitioner

## 2014-06-21 VITALS — BP 111/62 | HR 68 | Resp 18 | Wt 198.0 lb

## 2014-06-21 DIAGNOSIS — Z79899 Other long term (current) drug therapy: Secondary | ICD-10-CM | POA: Diagnosis not present

## 2014-06-21 DIAGNOSIS — I480 Paroxysmal atrial fibrillation: Secondary | ICD-10-CM | POA: Insufficient documentation

## 2014-06-21 DIAGNOSIS — I509 Heart failure, unspecified: Secondary | ICD-10-CM | POA: Insufficient documentation

## 2014-06-21 DIAGNOSIS — E119 Type 2 diabetes mellitus without complications: Secondary | ICD-10-CM | POA: Diagnosis not present

## 2014-06-21 DIAGNOSIS — Z8673 Personal history of transient ischemic attack (TIA), and cerebral infarction without residual deficits: Secondary | ICD-10-CM | POA: Diagnosis not present

## 2014-06-21 DIAGNOSIS — I7389 Other specified peripheral vascular diseases: Secondary | ICD-10-CM | POA: Diagnosis not present

## 2014-06-21 DIAGNOSIS — I4819 Other persistent atrial fibrillation: Secondary | ICD-10-CM

## 2014-06-21 DIAGNOSIS — Z87891 Personal history of nicotine dependence: Secondary | ICD-10-CM | POA: Insufficient documentation

## 2014-06-21 DIAGNOSIS — I10 Essential (primary) hypertension: Secondary | ICD-10-CM | POA: Insufficient documentation

## 2014-06-21 DIAGNOSIS — I481 Persistent atrial fibrillation: Secondary | ICD-10-CM

## 2014-06-21 DIAGNOSIS — Z7902 Long term (current) use of antithrombotics/antiplatelets: Secondary | ICD-10-CM | POA: Insufficient documentation

## 2014-06-21 DIAGNOSIS — E785 Hyperlipidemia, unspecified: Secondary | ICD-10-CM | POA: Diagnosis not present

## 2014-06-21 NOTE — Progress Notes (Signed)
PCP:  Karie ChimeraEESE,BETTI D, MD  The patient presents today for routine electrophysiology followup. She remains in sinus rhythm on amiodarone  and is doing well.   No symptoms of afib. Doing well on NOAC without any s/s of bleeding.  Today, she denies symptoms of palpitations, chest pain, shortness of breath, orthopnea, PND,   dizziness, presyncope, syncope, or neurologic sequela.  The patient feels that she is tolerating medications without difficulties and is otherwise without complaint today.   Past Medical History  Diagnosis Date  . HLD (hyperlipidemia)   . DM (dermatomyositis)   . PAD (peripheral artery disease)   . 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 Paroxysmal a-fib     [facility]/10  . Hypertension   . Diabetes mellitus without complication   . Stroke   . CHF (congestive heart failure)    Past Surgical History  Procedure Laterality Date  . Adenoidectomy    . Breast lumpectomy      benign  . Knee cartilage surgery    . Intraoperative arteriogram      for [facility]audication in L leg [facility]  . Endovascular stents      [facility]  . Femoral bypass  12/11/10    [facility]  . Cardioversion N/A 04/27/2013    Procedure: CARDIOVERSION;  Surgeon: Wendall StadePeter C Nishan, MD;  [facility];  Service: Cardiovascular;  Laterality: N/A;  . Cardioversion N/A 05/24/2013    Procedure: CARDIOVERSION;  Surgeon: Pricilla RifflePaula V Ross, MD;  [facility];  Service: Cardiovascular;  Laterality: N/A;  . Cardioversion N/A 05/31/2013    Procedure: CARDIOVERSION;  Surgeon: Hillis RangeJames Allred, MD;  [facility];  Service: Cardiovascular;  Laterality: N/A;    Current Outpatient Prescriptions  Medication Sig Dispense Refill  . amiodarone (PACERONE) 200 MG tablet Take 1 tablet (200 mg total) by mouth daily. 90 tablet 3  . apixaban (ELIQUIS) 5 MG TABS tablet Take 1 tablet (5 mg total) by mouth 2 (two) times daily. 180 tablet 3  .  furosemide (LASIX) 40 MG tablet Take 1 tablet (40 mg total) by mouth daily. 90 tablet 3  . IRON PO Take 325 mg by mouth daily.     . metFORMIN (GLUCOPHAGE) 500 MG tablet Take 2 tablets (1,000 mg total) by mouth 2 (two) times daily with a meal. 360 tablet 3  . metoprolol succinate (TOPROL-XL) 50 MG 24 hr tablet Take 1 tablet (50 mg total) by mouth daily. 90 tablet 3  . Multiple Vitamins-Minerals (ICAPS PO) Take 1 tablet by mouth daily.    . rosuvastatin (CRESTOR) 10 MG tablet Take 1 tablet (10 mg total) by mouth daily. 90 tablet 3  . VICTOZA 18 MG/3ML SOPN   2   No current facility-administered medications for this encounter.    No Known Allergies  History   Social History  . Marital Status: Single    Spouse Name: N/A    Number of Children: N/A  . Years of Education: N/A   Occupational History  . Not on file.   Social History Main Topics  . Smoking status: Former Smoker    Quit date: 05/12/2004  . Smokeless tobacco: Never Used     Comment: quit 2005  . Alcohol Use: Yes     Comment: occasionally  . Drug Use: No  . Sexual Activity: Not on file   Other Topics Concern  . Not  on file   Social History Narrative   [place] in [place].   Retired Product/process development scientistcomputer tech support    Family History  Problem Relation Age of Onset  . Lung cancer Mother   . Cancer Mother     Lung  . Diabetes Father     a fib also    Physical Exam: Filed Vitals:   06/21/14 1405  BP: 111/62  Pulse: 68  Resp: 18  Weight: 198 lb (89.812 kg)  SpO2: 99%    GEN- The patient is well appearing, alert and oriented x 3 today.   Head- normocephalic, atraumatic Eyes-  Sclera clear, conjunctiva pink Ears- hearing intact Oropharynx- clear Neck- supple, no JVP Lymph- no cervical lymphadenopathy Lungs- Clear to ausculation bilaterally, normal work of breathing Heart- Regular rate and rhythm, no murmurs, rubs or gallops, PMI not laterally displaced GI- soft, NT, ND, + BS Extremities- no  clubbing, cyanosis, trace edema  ekg today reveals sinus rhythm 73 bpm, QTc  453ms, unchanged from previous..  Assessment and Plan:  1. Afib/maintaining NSR Continue amiodarone 200mg  daily Continue eliquis Labs tsh/liver panel last drawn in September and nml. Will be due again in March 2016.  2. Chronic diastolic dysfunction Clinically much improved 2 gram sodium restriction advised  Return to see Dr. Johney FrameAllred in 3 months.

## 2014-06-21 NOTE — Patient Instructions (Signed)
Doing great!  Follow up in 3 months with Dr. Allred.  Merry Christmas and Happy New Year! 

## 2014-06-23 NOTE — Addendum Note (Signed)
Encounter addended by: Simon RheinSandra C Aida Lemaire, CCT on: 06/23/2014  9:13 AM<BR>     Documentation filed: Charges VN

## 2014-07-22 IMAGING — CR DG CHEST 1V PORT
1 series · 1 of 1 positions shown · non-contrast
Comparison: None.

CLINICAL DATA: Shortness of breath and nausea tonight.

EXAM:
PORTABLE CHEST - 1 VIEW

[portable]
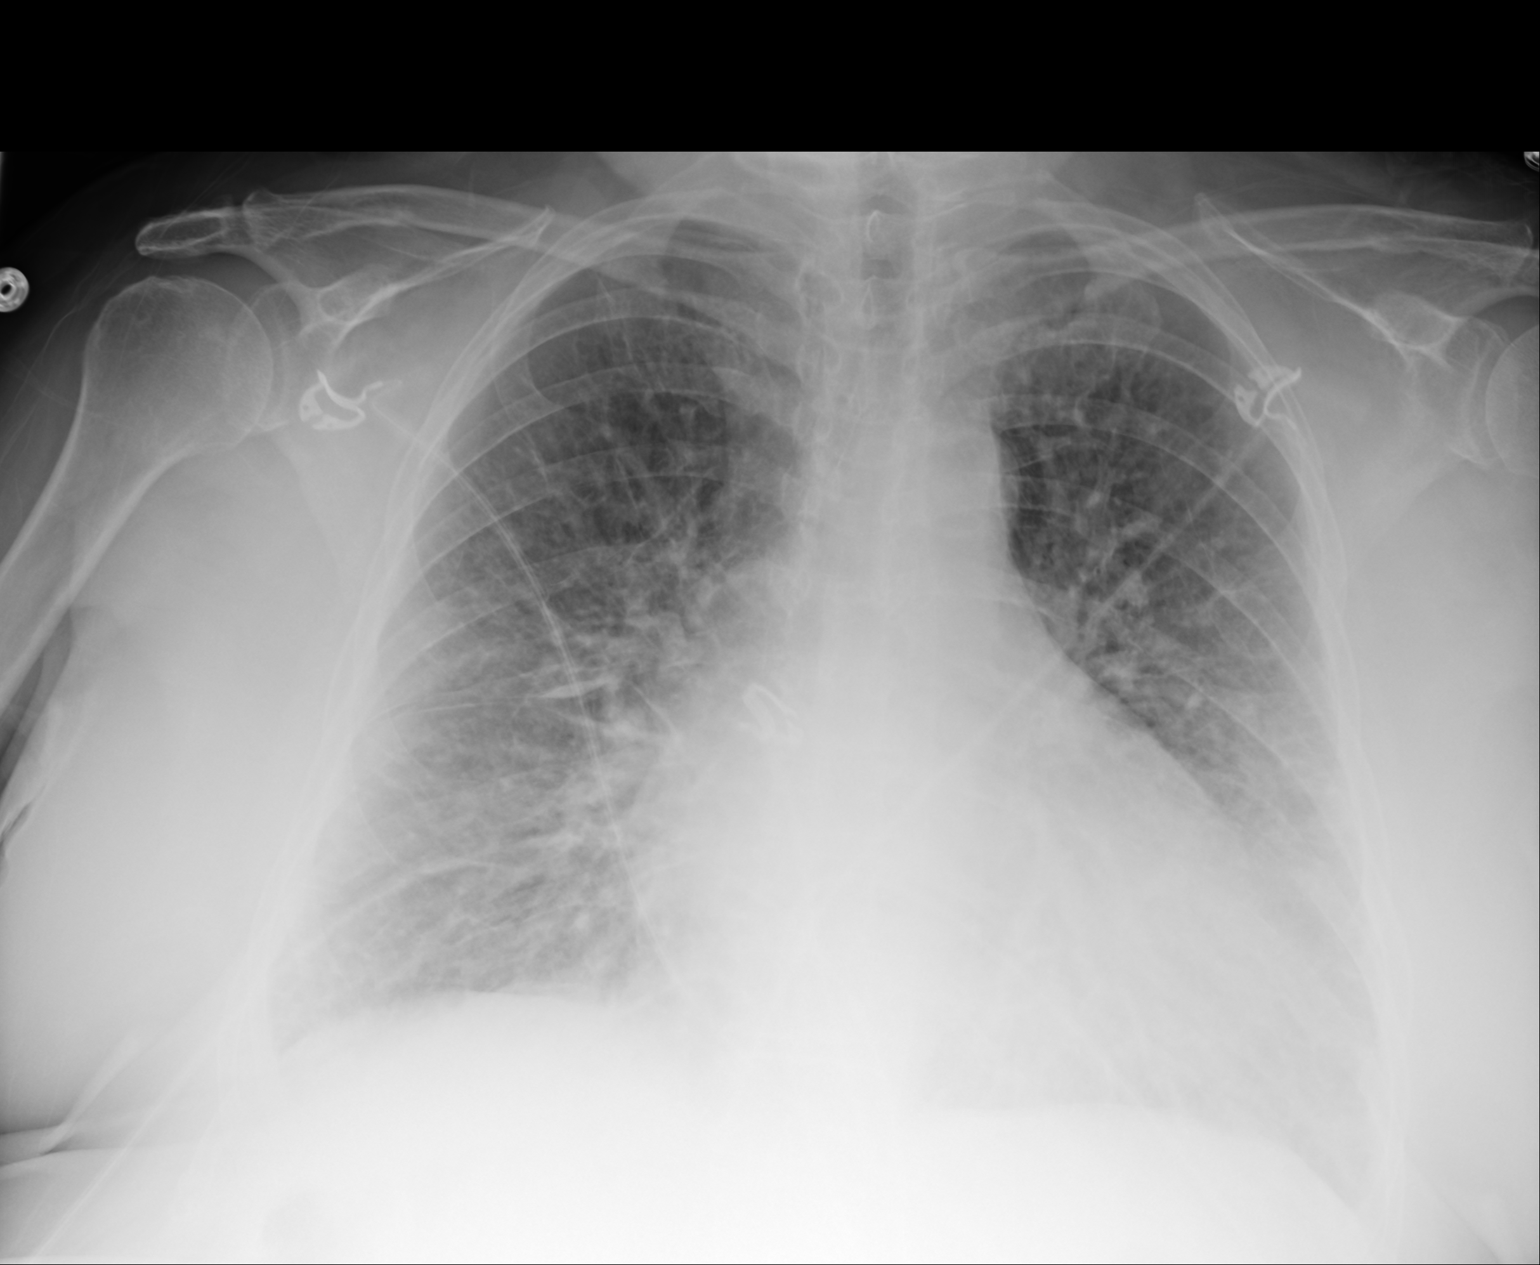

[1 of 1 positions shown; findings below may reference images not displayed]

FINDINGS: Mild cardiac enlargement with pulmonary vascular congestion.
Interstitial changes suggest interstitial edema. No focal
consolidation. No blunting of costophrenic angles. No pneumothorax.
IMPRESSION: Cardiac enlargement with pulmonary vascular congestion and
interstitial edema.

## 2014-09-05 ENCOUNTER — Ambulatory Visit (INDEPENDENT_AMBULATORY_CARE_PROVIDER_SITE_OTHER): Payer: Medicare Other | Admitting: Internal Medicine

## 2014-09-05 ENCOUNTER — Encounter: Payer: Self-pay | Admitting: Internal Medicine

## 2014-09-05 VITALS — BP 104/66 | HR 65 | Ht 63.0 in | Wt 201.2 lb

## 2014-09-05 DIAGNOSIS — I5032 Chronic diastolic (congestive) heart failure: Secondary | ICD-10-CM

## 2014-09-05 DIAGNOSIS — I4819 Other persistent atrial fibrillation: Secondary | ICD-10-CM

## 2014-09-05 DIAGNOSIS — I481 Persistent atrial fibrillation: Secondary | ICD-10-CM

## 2014-09-05 MED ORDER — AMIODARONE HCL 100 MG PO TABS
100.0000 mg | ORAL_TABLET | Freq: Every day | ORAL | Status: DC
Start: 2014-09-05 — End: 2015-10-03

## 2014-09-05 NOTE — Progress Notes (Signed)
PCP:  Karie ChimeraEESE,BETTI D, MD  The patient presents today for routine electrophysiology followup. She remains in sinus and is doing well.   No symptoms of afib.  Today, she denies symptoms of palpitations, chest pain, shortness of breath, orthopnea, PND,   dizziness, presyncope, syncope, or neurologic sequela.  The patient feels that she is tolerating medications without difficulties and is otherwise without complaint today.   Past Medical History  Diagnosis Date  . HLD (hyperlipidemia)   . DM (dermatomyositis)   . PAD (peripheral artery disease)   . 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 Paroxysmal a-fib     [facility]  . Hypertension   . Diabetes mellitus without complication   . Stroke   . CHF (congestive heart failure)    Past Surgical History  Procedure Laterality Date  . Adenoidectomy    . Breast lumpectomy      benign  . Knee cartilage surgery    . Intraoperative arteriogram      [facility] L [facility]  . Endovascular stents      [facility]  . Femoral bypass  12/11/10    [facility]  . Cardioversion N/A 04/27/2013    Procedure: CARDIOVERSION;  Surgeon: Wendall StadePeter C Nishan, MD;  [facility];  Service: Cardiovascular;  Laterality: N/A;  . Cardioversion N/A 05/24/2013    Procedure: CARDIOVERSION;  Surgeon: Pricilla RifflePaula V Ross, MD;  [facility];  Service: Cardiovascular;  Laterality: N/A;  . Cardioversion N/A 05/31/2013    Procedure: CARDIOVERSION;  Surgeon: Hillis RangeJames Istvan Behar, MD;  [facility];  Service: Cardiovascular;  Laterality: N/A;    Current Outpatient Prescriptions  Medication Sig Dispense Refill  . amiodarone (PACERONE) 200 MG tablet Take 1 tablet (200 mg total) by mouth daily. 90 tablet 3  . apixaban (ELIQUIS) 5 MG TABS tablet Take 1 tablet (5 mg total) by mouth 2 (two) times daily. 180 tablet 3  . furosemide (LASIX) 40 MG tablet Take 1 tablet (40 mg total) by mouth daily. 90 tablet 3   . IRON PO Take 325 mg by mouth daily.     . metFORMIN (GLUCOPHAGE) 500 MG tablet Take 2 tablets (1,000 mg total) by mouth 2 (two) times daily with a meal. 360 tablet 3  . metoprolol succinate (TOPROL-XL) 50 MG 24 hr tablet Take 1 tablet (50 mg total) by mouth daily. 90 tablet 3  . Multiple Vitamins-Minerals (ICAPS PO) Take 1 tablet by mouth daily.    . rosuvastatin (CRESTOR) 10 MG tablet Take 1 tablet (10 mg total) by mouth daily. 90 tablet 3   No current facility-administered medications for this visit.    No Known Allergies  History   Social History  . Marital Status: Single    Spouse Name: N/A  . Number of Children: N/A  . Years of Education: N/A   Occupational History  . Not on file.   Social History Main Topics  . Smoking status: Former Smoker    Quit date: 05/12/2004  . Smokeless tobacco: Never Used     Comment: quit 2005  . Alcohol Use: Yes     Comment: occasionally  . Drug Use: No  . Sexual Activity: Not on file   Other Topics Concern  . Not on file   Social History Narrative   [place] in [place].   Retired Product/process development scientistcomputer tech support    Family History  Problem Relation Age of Onset  . Lung cancer Mother   . Cancer Mother     Lung  . Diabetes Father     a fib also    Physical Exam: Filed Vitals:   09/05/14 1544  BP: 104/66  Pulse: 65  Height:  (1.6 m)  Weight: 201 lb 3.2 oz (91.264 kg)    GEN- The patient is well appearing, alert and oriented x 3 today.   Head- normocephalic, atraumatic Eyes-  Sclera clear, conjunctiva pink Ears- hearing intact Oropharynx- clear Neck- supple, no JVP Lymph- no cervical lymphadenopathy Lungs- Clear to ausculation bilaterally, normal work of breathing Heart- Regular rate and rhythm, no murmurs, rubs or gallops, PMI not laterally displaced GI- soft, NT, ND, + BS Extremities- no clubbing, cyanosis, trace edema  ekg today reveals sinus rhythm 65 bpm, otherwise normal ekg  Assessment and  Plan:  1. afib Decrease amiodarone to  daily Continue eliquis Lfts/tfts/bmet/ cbc today  2. Chronic diastolic dysfunction Clinically much improved 2 gram sodium restriction advised  Return to see Lupita Leash in 3 months

## 2014-09-05 NOTE — Patient Instructions (Addendum)
Your physician recommends that you schedule a follow-up appointment in: 3 months with Rudi Cocoonna Carroll  Your physician recommends that you return for lab work in: today - bmet, liver, tsh/t4, cbc  Your physician has recommended you make the following change in your medication:  1) decrease amiodarone to 100mg  once daily

## 2014-09-06 LAB — CBC WITH DIFFERENTIAL/PLATELET
BASOS PCT: 0.6 % (ref 0.0–3.0)
Basophils Absolute: 0 10*3/uL (ref 0.0–0.1)
EOS ABS: 0.2 10*3/uL (ref 0.0–0.7)
EOS PCT: 2.5 % (ref 0.0–5.0)
HCT: 32.9 % — ABNORMAL LOW (ref 36.0–46.0)
Hemoglobin: 11.2 g/dL — ABNORMAL LOW (ref 12.0–15.0)
LYMPHS PCT: 27.3 % (ref 12.0–46.0)
Lymphs Abs: 1.9 10*3/uL (ref 0.7–4.0)
MCHC: 34.1 g/dL (ref 30.0–36.0)
MCV: 86.1 fl (ref 78.0–100.0)
Monocytes Absolute: 0.3 10*3/uL (ref 0.1–1.0)
Monocytes Relative: 4.4 % (ref 3.0–12.0)
Neutro Abs: 4.5 10*3/uL (ref 1.4–7.7)
Neutrophils Relative %: 65.2 % (ref 43.0–77.0)
Platelets: 252 10*3/uL (ref 150.0–400.0)
RBC: 3.83 Mil/uL — AB (ref 3.87–5.11)
RDW: 13.1 % (ref 11.5–15.5)
WBC: 6.9 10*3/uL (ref 4.0–10.5)

## 2014-09-06 LAB — HEPATIC FUNCTION PANEL
ALBUMIN: 3.7 g/dL (ref 3.5–5.2)
ALK PHOS: 78 U/L (ref 39–117)
ALT: 12 U/L (ref 0–35)
AST: 13 U/L (ref 0–37)
Bilirubin, Direct: 0 mg/dL (ref 0.0–0.3)
Total Bilirubin: 0.4 mg/dL (ref 0.2–1.2)
Total Protein: 6.5 g/dL (ref 6.0–8.3)

## 2014-09-06 LAB — BASIC METABOLIC PANEL
BUN: 15 mg/dL (ref 6–23)
CHLORIDE: 107 meq/L (ref 96–112)
CO2: 29 mEq/L (ref 19–32)
Calcium: 8.8 mg/dL (ref 8.4–10.5)
Creatinine, Ser: 0.81 mg/dL (ref 0.40–1.20)
GFR: 73.81 mL/min (ref >60.00)
Glucose, Bld: 134 mg/dL — ABNORMAL HIGH (ref 70–99)
POTASSIUM: 4 meq/L (ref 3.5–5.1)
SODIUM: 140 meq/L (ref 135–145)

## 2014-09-06 LAB — T4, FREE: Free T4: 0.91 ng/dL (ref 0.60–1.60)

## 2014-09-06 LAB — TSH: TSH: 5.68 u[IU]/mL — ABNORMAL HIGH (ref 0.35–4.50)

## 2014-12-06 ENCOUNTER — Ambulatory Visit (HOSPITAL_COMMUNITY)
Admission: RE | Admit: 2014-12-06 | Discharge: 2014-12-06 | Disposition: A | Discharge status: Home / Self Care | Payer: Medicare Other | Source: Ambulatory Visit | Attending: Nurse Practitioner | Admitting: Nurse Practitioner

## 2014-12-06 ENCOUNTER — Telehealth (HOSPITAL_COMMUNITY): Payer: Self-pay | Admitting: *Deleted

## 2014-12-06 ENCOUNTER — Encounter (HOSPITAL_COMMUNITY): Payer: Self-pay | Admitting: Nurse Practitioner

## 2014-12-06 VITALS — BP 110/68 | HR 72 | Ht 62.0 in | Wt 199.6 lb

## 2014-12-06 DIAGNOSIS — I481 Persistent atrial fibrillation: Secondary | ICD-10-CM | POA: Diagnosis not present

## 2014-12-06 DIAGNOSIS — Z87891 Personal history of nicotine dependence: Secondary | ICD-10-CM | POA: Diagnosis not present

## 2014-12-06 DIAGNOSIS — Z79899 Other long term (current) drug therapy: Secondary | ICD-10-CM | POA: Diagnosis not present

## 2014-12-06 DIAGNOSIS — E785 Hyperlipidemia, unspecified: Secondary | ICD-10-CM | POA: Insufficient documentation

## 2014-12-06 DIAGNOSIS — E119 Type 2 diabetes mellitus without complications: Secondary | ICD-10-CM | POA: Insufficient documentation

## 2014-12-06 DIAGNOSIS — Z7902 Long term (current) use of antithrombotics/antiplatelets: Secondary | ICD-10-CM | POA: Diagnosis not present

## 2014-12-06 DIAGNOSIS — Z8673 Personal history of transient ischemic attack (TIA), and cerebral infarction without residual deficits: Secondary | ICD-10-CM | POA: Insufficient documentation

## 2014-12-06 DIAGNOSIS — I4891 Unspecified atrial fibrillation: Secondary | ICD-10-CM | POA: Diagnosis not present

## 2014-12-06 DIAGNOSIS — I10 Essential (primary) hypertension: Secondary | ICD-10-CM | POA: Diagnosis not present

## 2014-12-06 DIAGNOSIS — I509 Heart failure, unspecified: Secondary | ICD-10-CM | POA: Insufficient documentation

## 2014-12-06 DIAGNOSIS — I739 Peripheral vascular disease, unspecified: Secondary | ICD-10-CM | POA: Insufficient documentation

## 2014-12-06 DIAGNOSIS — I4819 Other persistent atrial fibrillation: Secondary | ICD-10-CM

## 2014-12-06 LAB — TSH: TSH: 6.473 u[IU]/mL — ABNORMAL HIGH (ref 0.350–4.500)

## 2014-12-06 LAB — T4, FREE: FREE T4: 1.01 ng/dL (ref 0.61–1.12)

## 2014-12-06 NOTE — Patient Instructions (Signed)
Code for [phone]

## 2014-12-06 NOTE — Telephone Encounter (Signed)
Pt notified of bloodwork results. Instructed pt to follow up with primary physician regarding.  Patient states she has appt to see primary in July. Rudi Cocoonna Carroll NP did send copy of pts results to primary physician.

## 2014-12-06 NOTE — Progress Notes (Signed)
Patient ID: Sherry Vargas, female   DOB: 11/26/1940, 74 y.o.   MRN: [id number]     Primary Care Physician: Karie ChimeraEESE,BETTI D, MD Referring Physician:   Nyoka LintCarol A Ostrovsky is a 74 y.o. female with a h/o atrial fibrillation maintaining SR on amiodarone. On last visit the dose was decreased to 100 mg daily and this appears to be keeping her in rhythm. She was suppose to have had her thyroid rechecked in April but forgot to mention. Overall feels well. No bleeding issues with OAC and taking regularly.  Today, she denies symptoms of palpitations, chest pain, shortness of breath, orthopnea, PND, lower extremity edema, dizziness, presyncope, syncope, or neurologic sequela. The patient is tolerating medications without difficulties and is otherwise without complaint today.   Past Medical History  Diagnosis Date  . HLD (hyperlipidemia)   . DM (dermatomyositis)   . PAD (peripheral artery disease)   . 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 Paroxysmal a-fib     post operative [facility] 12/11/10  . Hypertension   . Diabetes mellitus without complication   . Stroke   . CHF (congestive heart failure)    Past Surgical History  Procedure Laterality Date  . Adenoidectomy    . Breast lumpectomy      benign  . Knee cartilage surgery    . Intraoperative arteriogram      for [facility]audication in L leg [facility]  . Endovascular stents      R leg at [facility]  . Femoral bypass  12/11/10    [facility]  . Cardioversion N/A 04/27/2013    Procedure: CARDIOVERSION;  Surgeon: Wendall StadePeter C Nishan, MD;  [facility];  Service: Cardiovascular;  Laterality: N/A;  . Cardioversion N/A 05/24/2013    Procedure: CARDIOVERSION;  Surgeon: Pricilla RifflePaula V Ross, MD;  [facility];  Service: Cardiovascular;  Laterality: N/A;  . Cardioversion N/A 05/31/2013    Procedure: CARDIOVERSION;  Surgeon: Hillis RangeJames Allred, MD;  [facility];  Service: Cardiovascular;  Laterality: N/A;    Current  Outpatient Prescriptions  Medication Sig Dispense Refill  . amiodarone (PACERONE) 100 MG tablet Take 1 tablet (100 mg total) by mouth daily. 90 tablet 3  . apixaban (ELIQUIS) 5 MG TABS tablet Take 1 tablet (5 mg total) by mouth 2 (two) times daily. 180 tablet 3  . furosemide (LASIX) 40 MG tablet Take 1 tablet (40 mg total) by mouth daily. 90 tablet 3  . metFORMIN (GLUCOPHAGE) 500 MG tablet Take 2 tablets (1,000 mg total) by mouth 2 (two) times daily with a meal. 360 tablet 3  . metoprolol succinate (TOPROL-XL) 50 MG 24 hr tablet Take 1 tablet (50 mg total) by mouth daily. 90 tablet 3  . Multiple Vitamins-Minerals (ICAPS PO) Take 1 tablet by mouth daily.    . rosuvastatin (CRESTOR) 10 MG tablet Take 1 tablet (10 mg total) by mouth daily. 90 tablet 3   No current facility-administered medications for this encounter.    No Known Allergies  History   Social History  . Marital Status: Single    Spouse Name: N/A  . Number of Children: N/A  . Years of Education: N/A   Occupational History  . Not on file.   Social History Main Topics  . Smoking status: Former Smoker    Quit date: 05/12/2004  . Smokeless tobacco: Never Used     Comment: quit 2005  . Alcohol Use: Yes     Comment:  occasionally  . Drug Use: No  . Sexual Activity: Not on file   Other Topics Concern  . Not on file   Social History Narrative   Lives in [place], near [place].   Retired Product/process development scientist    Family History  Problem Relation Age of Onset  . Lung cancer Mother   . Cancer Mother     Lung  . Diabetes Father     a fib also    ROS- All systems are reviewed and negative except as per the HPI above  Physical Exam: Filed Vitals:   12/06/14 1110  BP: 110/68  Pulse: 72  Height:  (1.575 m)  Weight: 199 lb 9.6 oz (90.538 kg)    GEN- The patient is well appearing, alert and oriented x 3 today.   Head- normocephalic, atraumatic Eyes-  Sclera clear, conjunctiva pink Ears- hearing  intact Oropharynx- clear Neck- supple, no JVP Lymph- no cervical lymphadenopathy Lungs- Clear to ausculation bilaterally, normal work of breathing Heart- Regular rate and rhythm, no murmurs, rubs or gallops, PMI not laterally displaced GI- soft, NT, ND, + BS Extremities- no clubbing, cyanosis, or edema MS- no significant deformity or atrophy Skin- no rash or lesion Psych- euthymic mood, full affect Neuro- strength and sensation are intact  EKG-SR at 72 bpm, PR 138 ms, QRS 70,Qtc 442 ms.  Assessment and Plan:  1. Afib Maintaining SR on 100 mg amiodarone TSH,free t4 today due to tsh being elevated in march bmet and liver just checked in March and ok. Continue OAC   2. HTN Stable  3.Prior CVA Continue DOAC  F/u in afib clinic in 3 months

## 2014-12-23 ENCOUNTER — Telehealth (HOSPITAL_COMMUNITY): Payer: Self-pay | Admitting: *Deleted

## 2014-12-23 NOTE — Telephone Encounter (Signed)
Pt called stating HR 100-130s on Wednesday night for several hours -- states back in normal rhythm since that episode but was concerned that she went into afib since shes on amiodarone.  Educated patient that no medication treats afib 100% of the time but she can take an extra 25mg  of metoprolol if her heart rate is fast and bp is >100. If episodes become more frequent encouraged her to call back due to amiodarone dose may need increasing.  Patient had better understanding and will try extra 1/4 of her metoprolol if recurrence of afib episode.

## 2015-03-07 ENCOUNTER — Other Ambulatory Visit (HOSPITAL_COMMUNITY): Payer: Self-pay | Admitting: *Deleted

## 2015-03-07 MED ORDER — FUROSEMIDE 40 MG PO TABS: 40.0000 mg | ORAL_TABLET | Freq: Every day | ORAL | Status: DC

## 2015-03-13 ENCOUNTER — Ambulatory Visit (HOSPITAL_COMMUNITY)
Admission: RE | Admit: 2015-03-13 | Discharge: 2015-03-13 | Disposition: A | Discharge status: Home / Self Care | Payer: Medicare Other | Source: Ambulatory Visit | Attending: Nurse Practitioner | Admitting: Nurse Practitioner

## 2015-03-13 ENCOUNTER — Encounter (HOSPITAL_COMMUNITY): Payer: Self-pay | Admitting: Nurse Practitioner

## 2015-03-13 VITALS — BP 124/76 | HR 61 | Ht 63.0 in | Wt 208.2 lb

## 2015-03-13 DIAGNOSIS — I10 Essential (primary) hypertension: Secondary | ICD-10-CM | POA: Insufficient documentation

## 2015-03-13 DIAGNOSIS — I480 Paroxysmal atrial fibrillation: Secondary | ICD-10-CM

## 2015-03-13 LAB — COMPREHENSIVE METABOLIC PANEL
ALK PHOS: 74 U/L (ref 38–126)
ALT: 20 U/L (ref 14–54)
ANION GAP: 7 (ref 5–15)
AST: 24 U/L (ref 15–41)
Albumin: 3.4 g/dL — ABNORMAL LOW (ref 3.5–5.0)
BILIRUBIN TOTAL: 0.5 mg/dL (ref 0.3–1.2)
BUN: 13 mg/dL (ref 6–20)
CALCIUM: 9.5 mg/dL (ref 8.9–10.3)
CO2: 28 mmol/L (ref 22–32)
Chloride: 106 mmol/L (ref 101–111)
Creatinine, Ser: 0.78 mg/dL (ref 0.44–1.00)
GFR calc Af Amer: >60 mL/min (ref >60)
GLUCOSE: 163 mg/dL — AB (ref 65–99)
Potassium: 4.5 mmol/L (ref 3.5–5.1)
Sodium: 141 mmol/L (ref 135–145)
TOTAL PROTEIN: 6.2 g/dL — AB (ref 6.5–8.1)

## 2015-03-13 LAB — CBC
HCT: 33.8 % — ABNORMAL LOW (ref 36.0–46.0)
HEMOGLOBIN: 11.1 g/dL — AB (ref 12.0–15.0)
MCH: 29 pg (ref 26.0–34.0)
MCHC: 32.8 g/dL (ref 30.0–36.0)
MCV: 88.3 fL (ref 78.0–100.0)
Platelets: 224 10*3/uL (ref 150–400)
RBC: 3.83 MIL/uL — ABNORMAL LOW (ref 3.87–5.11)
RDW: 13.8 % (ref 11.5–15.5)
WBC: 6.2 10*3/uL (ref 4.0–10.5)

## 2015-03-13 LAB — T4, FREE: Free T4: 1.11 ng/dL (ref 0.61–1.12)

## 2015-03-13 LAB — TSH: TSH: 9.292 u[IU]/mL — ABNORMAL HIGH (ref 0.350–4.500)

## 2015-03-13 NOTE — Patient Instructions (Signed)
Parking code for dec [id number] 

## 2015-03-13 NOTE — Progress Notes (Signed)
Patient ID: Sherry Vargas, female   DOB: 08-May-1941, 74 y.o.   MRN: [id number]     Primary Care Physician: Karie Chimera, MD Referring Physician: Dr. Lawernce Pitts is a 74 y.o. female with a h/o atrial fibrillation maintaining SR on amiodarone. On  the dose is now reduced  to 100 mg daily and this appears to be keeping her in rhythm. Describes one episode of afib lasted less than one hour, otherwise maintaining SR. Overall feels well. No bleeding issues with OAC and taking regularly.  Today, she denies symptoms of palpitations, chest pain, shortness of breath, orthopnea, PND, lower extremity edema, dizziness, presyncope, syncope, or neurologic sequela. The patient is tolerating medications without difficulties and is otherwise without complaint today.   Past Medical History  Diagnosis Date  . HLD (hyperlipidemia)   . DM (dermatomyositis)   . PAD (peripheral artery disease)   . 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 Paroxysmal a-fib     post operative following fem bypass at [facility] 12/11/10  . Hypertension   . Diabetes mellitus without complication   . Stroke   . CHF (congestive heart failure)    Past Surgical History  Procedure Laterality Date  . Adenoidectomy    . Breast lumpectomy      benign  . Knee cartilage surgery    . Intraoperative arteriogram      for claudication in L leg 2004 at [facility]  . Endovascular stents      R leg at [facility]  . Femoral bypass  12/11/10    R leg at [facility]  . Cardioversion N/A 04/27/2013    Procedure: CARDIOVERSION;  Surgeon: Wendall Stade, MD;  Location: [facility] ENDOSCOPY;  Service: Cardiovascular;  Laterality: N/A;  . Cardioversion N/A 05/24/2013    Procedure: CARDIOVERSION;  Surgeon: Pricilla Riffle, MD;  Location: [facility] ENDOSCOPY;  Service: Cardiovascular;  Laterality: N/A;  . Cardioversion N/A 05/31/2013    Procedure: CARDIOVERSION;  Surgeon: Hillis Range, MD;  Location: [facility] OR;  Service: Cardiovascular;  Laterality: N/A;     Current Outpatient Prescriptions  Medication Sig Dispense Refill  . amiodarone (PACERONE) 100 MG tablet Take 1 tablet (100 mg total) by mouth daily. 90 tablet 3  . apixaban (ELIQUIS) 5 MG TABS tablet Take 1 tablet (5 mg total) by mouth 2 (two) times daily. 180 tablet 3  . furosemide (LASIX) 40 MG tablet Take 1 tablet (40 mg total) by mouth daily. 90 tablet 3  . metFORMIN (GLUCOPHAGE) 500 MG tablet Take 2 tablets (1,000 mg total) by mouth 2 (two) times daily with a meal. 360 tablet 3  . metoprolol succinate (TOPROL-XL) 50 MG 24 hr tablet Take 1 tablet (50 mg total) by mouth daily. 90 tablet 3  . Multiple Vitamins-Minerals (ICAPS PO) Take 1 tablet by mouth daily.    . rosuvastatin (CRESTOR) 10 MG tablet Take 1 tablet (10 mg total) by mouth daily. 90 tablet 3   No current facility-administered medications for this encounter.    No Known Allergies  Social History   Social History  . Marital Status: Single    Spouse Name: N/A  . Number of Children: N/A  . Years of Education: N/A   Occupational History  . Not on file.   Social History Main Topics  . Smoking status: Former Smoker    Quit date: 05/12/2004  . Smokeless tobacco: Never Used     Comment: quit 2005  . Alcohol Use: Yes  Comment: occasionally  . Drug Use: No  . Sexual Activity: Not on file   Other Topics Concern  . Not on file   Social History Narrative   Lives in [place], near [place].   Retired Product/process development scientist    Family History  Problem Relation Age of Onset  . Lung cancer Mother   . Cancer Mother     Lung  . Diabetes Father     a fib also    ROS- All systems are reviewed and negative except as per the HPI above  Physical Exam: Filed Vitals:   03/13/15 1129  BP: 124/76  Pulse: 61  Height: 5\' 3"  (1.6 m)  Weight: 208 lb 3.2 oz (94.439 kg)    GEN- The patient is well appearing, alert and oriented x 3 today.   Head- normocephalic, atraumatic Eyes-  Sclera clear, conjunctiva  pink Ears- hearing intact Oropharynx- clear Neck- supple, no JVP Lymph- no cervical lymphadenopathy Lungs- Clear to ausculation bilaterally, normal work of breathing Heart- Regular rate and rhythm, no murmurs, rubs or gallops, PMI not laterally displaced GI- soft, NT, ND, + BS Extremities- no clubbing, cyanosis, or edema MS- no significant deformity or atrophy Skin- no rash or lesion Psych- euthymic mood, full affect Neuro- strength and sensation are intact  EKG-SR at 61 bpm, PR 148 ms, QRS 72 ms,Qtc 444 ms.  Assessment and Plan:  1. Afib Maintaining SR on 100 mg amiodarone TSH,free t4 today, cmet,cbc today Continue OAC   2. HTN Stable  3. Prior CVA Continue DOAC  F/u in afib clinic in 3 months

## 2015-04-03 ENCOUNTER — Other Ambulatory Visit: Payer: Self-pay | Admitting: Internal Medicine

## 2015-05-18 ENCOUNTER — Encounter (HOSPITAL_COMMUNITY): Payer: Medicare Other

## 2015-05-24 ENCOUNTER — Ambulatory Visit: Payer: Self-pay | Admitting: Family

## 2015-06-12 ENCOUNTER — Ambulatory Visit (HOSPITAL_COMMUNITY)
Admission: RE | Admit: 2015-06-12 | Discharge: 2015-06-12 | Disposition: A | Discharge status: Home / Self Care | Payer: Medicare Other | Source: Ambulatory Visit | Attending: Nurse Practitioner | Admitting: Nurse Practitioner

## 2015-06-12 VITALS — BP 110/64 | HR 66 | Ht 63.0 in | Wt 209.6 lb

## 2015-06-12 DIAGNOSIS — Z8673 Personal history of transient ischemic attack (TIA), and cerebral infarction without residual deficits: Secondary | ICD-10-CM | POA: Insufficient documentation

## 2015-06-12 DIAGNOSIS — I4891 Unspecified atrial fibrillation: Secondary | ICD-10-CM | POA: Insufficient documentation

## 2015-06-12 DIAGNOSIS — I4819 Other persistent atrial fibrillation: Secondary | ICD-10-CM

## 2015-06-12 DIAGNOSIS — I481 Persistent atrial fibrillation: Secondary | ICD-10-CM

## 2015-06-12 DIAGNOSIS — I10 Essential (primary) hypertension: Secondary | ICD-10-CM | POA: Insufficient documentation

## 2015-06-12 NOTE — Progress Notes (Signed)
Patient ID: Sherry Vargas, female   DOB: 1940-09-24, 74 y.o.   MRN: [id number]     Primary Care Physician: Karie Chimera, MD Referring Physician:Dr.Allred   Sherry Vargas is a 74 y.o. female with a h/o atrial fibrillation maintaining SR on amiodarone. The dose was decreased to 100 mg daily and this appears to be keeping her in rhythm.TSH was elevated and PCP started her on thyroid replacement this past fall and she is pending an appointment with her in January to have rechecked. She feels well and is not noticing any afib.  No bleeding issues with DOAC and taking regularly.  Today, she denies symptoms of palpitations, chest pain, shortness of breath, orthopnea, PND, lower extremity edema, dizziness, presyncope, syncope, or neurologic sequela. The patient is tolerating medications without difficulties and is otherwise without complaint today.   Past Medical History  Diagnosis Date  . HLD (hyperlipidemia)   . DM (dermatomyositis)   . PAD (peripheral artery disease)   . 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 Paroxysmal a-fib     post operative following fem bypass at [facility] 12/11/10  . Hypertension   . Diabetes mellitus without complication   . Stroke   . CHF (congestive heart failure)    Past Surgical History  Procedure Laterality Date  . Adenoidectomy    . Breast lumpectomy      benign  . Knee cartilage surgery    . Intraoperative arteriogram      for claudication in L leg 2004 at [facility]  . Endovascular stents      R leg at [facility]  . Femoral bypass  12/11/10    R leg at [facility]  . Cardioversion N/A 04/27/2013    Procedure: CARDIOVERSION;  Surgeon: Wendall Stade, MD;  Location: [facility] ENDOSCOPY;  Service: Cardiovascular;  Laterality: N/A;  . Cardioversion N/A 05/24/2013    Procedure: CARDIOVERSION;  Surgeon: Pricilla Riffle, MD;  Location: [facility] ENDOSCOPY;  Service: Cardiovascular;  Laterality: N/A;  . Cardioversion N/A 05/31/2013    Procedure: CARDIOVERSION;  Surgeon: Hillis Range, MD;  Location: [facility] OR;  Service: Cardiovascular;  Laterality: N/A;    Current Outpatient Prescriptions  Medication Sig Dispense Refill  . amiodarone (PACERONE) 100 MG tablet Take 1 tablet (100 mg total) by mouth daily. 90 tablet 3  . ELIQUIS 5 MG TABS tablet TAKE 1 TABLET TWICE A DAY 180 tablet 1  . furosemide (LASIX) 40 MG tablet Take 1 tablet (40 mg total) by mouth daily. 90 tablet 3  . hydrOXYzine (ATARAX/VISTARIL) 25 MG tablet Take 25 mg by mouth 4 (four) times daily.    [name] levothyroxine (SYNTHROID, LEVOTHROID) 75 MCG tablet Take 75 mcg by mouth daily before breakfast.    . metFORMIN (GLUCOPHAGE) 500 MG tablet Take 2 tablets (1,000 mg total) by mouth 2 (two) times daily with a meal. 360 tablet 3  . metoprolol succinate (TOPROL-XL) 50 MG 24 hr tablet Take 1 tablet (50 mg total) by mouth daily. 90 tablet 3  . Multiple Vitamins-Minerals (ICAPS PO) Take 1 tablet by mouth daily.    . rosuvastatin (CRESTOR) 10 MG tablet Take 1 tablet (10 mg total) by mouth daily. 90 tablet 3   No current facility-administered medications for this encounter.    No Known Allergies  Social History   Social History  . Marital Status: Single    Spouse Name: N/A  . Number of Children: N/A  . Years of Education: N/A   Occupational History  .  Not on file.   Social History Main Topics  . Smoking status: Former Smoker    Quit date: 05/12/2004  . Smokeless tobacco: Never Used     Comment: quit 2005  . Alcohol Use: Yes     Comment: occasionally  . Drug Use: No  . Sexual Activity: Not on file   Other Topics Concern  . Not on file   Social History Narrative   [place].   Retired Product/process development scientistcomputer tech support    Family History  Problem Relation Age of Onset  . Lung cancer Mother   . Cancer Mother     Lung  . Diabetes Father     a fib also    ROS- All systems are reviewed and negative except as per the HPI above  Physical Exam: Filed Vitals:   06/12/15 1145    BP: 110/64  Pulse: 66  Height: 5\' 3"  (1.6 m)  Weight: 209 lb 9.6 oz (95.074 kg)    GEN- The patient is well appearing, alert and oriented x 3 today.   Head- normocephalic, atraumatic Eyes-  Sclera clear, conjunctiva pink Ears- hearing intact Oropharynx- clear Neck- supple, no JVP Lymph- no cervical lymphadenopathy Lungs- Clear to ausculation bilaterally, normal work of breathing Heart- Regular rate and rhythm, no murmurs, rubs or gallops, PMI not laterally displaced GI- soft, NT, ND, + BS Extremities- no clubbing, cyanosis, or edema MS- no significant deformity or atrophy Skin- no rash or lesion Psych- euthymic mood, full affect Neuro- strength and sensation are intact  EKG-SR at 66 bpm, PR 134 ms, QRS 74 ms,Qtc 450 ms. Epic records reviewed  Assessment and Plan:  1. Afib Maintaining SR on 100 mg amiodarone Recently started on levothyroxine and pending f/u in January with pcp. bmet and liver just checked in Sept and ok. Continue OAC   2. HTN Stable  3.Prior CVA Continue DOAC  F/u in afib clinic in 4 months  Lupita LeashDonna C. Matthew Folksarroll, ANP-C Afib Clinic [facility] [address] [place], [place] [address] [phone]

## 2015-07-07 ENCOUNTER — Encounter: Payer: Self-pay | Admitting: Family

## 2015-07-12 ENCOUNTER — Other Ambulatory Visit: Payer: Self-pay | Admitting: *Deleted

## 2015-07-12 ENCOUNTER — Ambulatory Visit (INDEPENDENT_AMBULATORY_CARE_PROVIDER_SITE_OTHER)
Admission: RE | Admit: 2015-07-12 | Discharge: 2015-07-12 | Disposition: A | Discharge status: Home / Self Care | Payer: Medicare Other | Source: Ambulatory Visit | Attending: Family | Admitting: Family

## 2015-07-12 ENCOUNTER — Ambulatory Visit (INDEPENDENT_AMBULATORY_CARE_PROVIDER_SITE_OTHER): Payer: Medicare Other | Admitting: Family

## 2015-07-12 ENCOUNTER — Ambulatory Visit (HOSPITAL_COMMUNITY)
Admission: RE | Admit: 2015-07-12 | Discharge: 2015-07-12 | Disposition: A | Discharge status: Home / Self Care | Payer: Medicare Other | Source: Ambulatory Visit | Attending: Family | Admitting: Family

## 2015-07-12 ENCOUNTER — Other Ambulatory Visit: Payer: Self-pay | Admitting: Family

## 2015-07-12 ENCOUNTER — Encounter: Payer: Self-pay | Admitting: Family

## 2015-07-12 VITALS — BP 111/69 | HR 66 | Temp 97.1°F | Resp 16 | Ht 63.0 in | Wt 205.0 lb

## 2015-07-12 DIAGNOSIS — Z87891 Personal history of nicotine dependence: Secondary | ICD-10-CM | POA: Diagnosis not present

## 2015-07-12 DIAGNOSIS — I779 Disorder of arteries and arterioles, unspecified: Secondary | ICD-10-CM | POA: Diagnosis not present

## 2015-07-12 DIAGNOSIS — I739 Peripheral vascular disease, unspecified: Secondary | ICD-10-CM | POA: Insufficient documentation

## 2015-07-12 DIAGNOSIS — Z95828 Presence of other vascular implants and grafts: Secondary | ICD-10-CM

## 2015-07-12 NOTE — Patient Instructions (Signed)
Peripheral Vascular Disease Peripheral vascular disease (PVD) is a disease of the blood vessels that are not part of your heart and brain. A simple term for PVD is poor circulation. In most cases, PVD narrows the blood vessels that carry blood from your heart to the rest of your body. This can result in a decreased supply of blood to your arms, legs, and internal organs, like your stomach or kidneys. However, it most often affects a person's lower legs and feet. There are two types of PVD.  Organic PVD. This is the more common type. It is caused by damage to the structure of blood vessels.  Functional PVD. This is caused by conditions that make blood vessels contract and tighten (spasm). Without treatment, PVD tends to get worse over time. PVD can also lead to acute ischemic limb. This is when an arm or limb suddenly has trouble getting enough blood. This is a medical emergency. CAUSES Each type of PVD has many different causes. The most common cause of PVD is buildup of a fatty material (plaque) inside of your arteries (atherosclerosis). Small amounts of plaque can break off from the walls of the blood vessels and become lodged in a smaller artery. This blocks blood flow and can cause acute ischemic limb. Other common causes of PVD include:  Blood clots that form inside of blood vessels.  Injuries to blood vessels.  Diseases that cause inflammation of blood vessels or cause blood vessel spasms.  Health behaviors and health history that increase your risk of developing PVD. RISK FACTORS  You may have a greater risk of PVD if you:  Have a family history of PVD.  Have certain medical conditions, including:  High cholesterol.  Diabetes.  High blood pressure (hypertension).  Coronary heart disease.  Past problems with blood clots.  Past injury, such as burns or a broken bone. These may have damaged blood vessels in your limbs.  Buerger disease. This is caused by inflamed blood  vessels in your hands and feet.  Some forms of arthritis.  Rare birth defects that affect the arteries in your legs.  Use tobacco.  Do not get enough exercise.  Are obese.  Are age 50 or older. SIGNS AND SYMPTOMS  PVD may cause many different symptoms. Your symptoms depend on what part of your body is not getting enough blood. Some common signs and symptoms include:  Cramps in your lower legs. This may be a symptom of poor leg circulation (claudication).  Pain and weakness in your legs while you are physically active that goes away when you rest (intermittent claudication).  Leg pain when at rest.  Leg numbness, tingling, or weakness.  Coldness in a leg or foot, especially when compared with the other leg.  Skin or hair changes. These can include:  Hair loss.  Shiny skin.  Pale or bluish skin.  Thick toenails.  Inability to get or maintain an erection (erectile dysfunction). People with PVD are more prone to developing ulcers and sores on their toes, feet, or legs. These may take longer than normal to heal. DIAGNOSIS Your health care provider may diagnose PVD from your signs and symptoms. The health care provider will also do a physical exam. You may have tests to find out what is causing your PVD and determine its severity. Tests may include:  Blood pressure recordings from your arms and legs and measurements of the strength of your pulses (pulse volume recordings).  Imaging studies using sound waves to take pictures of   the blood flow through your blood vessels (Doppler ultrasound).  Injecting a dye into your blood vessels before having imaging studies using:  X-rays (angiogram or arteriogram).  Computer-generated X-rays (CT angiogram).  A powerful electromagnetic field and a computer (magnetic resonance angiogram or MRA). TREATMENT Treatment for PVD depends on the cause of your condition and the severity of your symptoms. It also depends on your age. Underlying  causes need to be treated and controlled. These include long-lasting (chronic) conditions, such as diabetes, high cholesterol, and high blood pressure. You may need to first try making lifestyle changes and taking medicines. Surgery may be needed if these do not work. Lifestyle changes may include:  Quitting smoking.  Exercising regularly.  Following a low-fat, low-cholesterol diet. Medicines may include:  Blood thinners to prevent blood clots.  Medicines to improve blood flow.  Medicines to improve your blood cholesterol levels. Surgical procedures may include:  A procedure that uses an inflated balloon to open a blocked artery and improve blood flow (angioplasty).  A procedure to put in a tube (stent) to keep a blocked artery open (stent implant).  Surgery to reroute blood flow around a blocked artery (peripheral bypass surgery).  Surgery to remove dead tissue from an infected wound on the affected limb.  Amputation. This is surgical removal of the affected limb. This may be necessary in cases of acute ischemic limb that are not improved through medical or surgical treatments. HOME CARE INSTRUCTIONS  Take medicines only as directed by your health care provider.  Do not use any tobacco products, including cigarettes, chewing tobacco, or electronic cigarettes. If you need help quitting, ask your health care provider.  Lose weight if you are overweight, and maintain a healthy weight as directed by your health care provider.  Eat a diet that is low in fat and cholesterol. If you need help, ask your health care provider.  Exercise regularly. Ask your health care provider to suggest some good activities for you.  Use compression stockings or other mechanical devices as directed by your health care provider.  Take good care of your feet.  Wear comfortable shoes that fit well.  Check your feet often for any cuts or sores. SEEK MEDICAL CARE IF:  You have cramps in your legs  while walking.  You have leg pain when you are at rest.  You have coldness in a leg or foot.  Your skin changes.  You have erectile dysfunction.  You have cuts or sores on your feet that are not healing. SEEK IMMEDIATE MEDICAL CARE IF:  Your arm or leg turns cold and blue.  Your arms or legs become red, warm, swollen, painful, or numb.  You have chest pain or trouble breathing.  You suddenly have weakness in your face, arm, or leg.  You become very confused or lose the ability to speak.  You suddenly have a very bad headache or lose your vision.   This information is not intended to replace advice given to you by your health care provider. Make sure you discuss any questions you have with your health care provider.   Document Released: 07/25/2004 Document Revised: 07/08/2014 Document Reviewed: 11/25/2013 Elsevier Interactive Patient Education 2016 [employer]  

## 2015-07-12 NOTE — Progress Notes (Signed)
[facility] [place] HISTORY AND PHYSICAL -PAD  History of Present Illness Sherry Vargas is a 75 y.o. female patient of Dr. Darrick PennaFields who returns for follow up evaluation of PAD.  The patient has a history of a right femoral to below-knee popliteal bypass with vein at [facility] in 2012. This was done for claudication at 200 feet. She thinks she has also had angioplasties in her left leg in the past. She is on Plavix.  She denies claudication symptoms with walking, denies non healing wounds. 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 Other medical problems include atrial fibrillation which is followed by Dr. Johney FrameAllred. She currently has been limited in her exercise secondary to this. She is on medication for rate control as well as Eliquis for anticoagulation. She also has a history of diabetes and hyperlipidemia both of which are currently stable. 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  The patient reports New Medical or Surgical History: she was hospitalized November, 2014 for CHF, pulmonary edema, and a-fib. She reports having several cardioversions and the last one converted her to NSR without relapsing into a-fib.  She stopped going to water aerobics.   Pt Diabetic: Yes, states her last A1C was 7.1; she has trouble remembering to take her morning meds. Pt smoker: former smoker, quit in the 1970's, then again in 2005  Pt meds include: Statin :Yes Betablocker: Yes ASA: No Other anticoagulants/antiplatelets: Eliquis    Past Medical History  Diagnosis Date  . HLD (hyperlipidemia)   . DM (dermatomyositis)   . PAD (peripheral artery disease)   . 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 Paroxysmal a-fib     post operative following [facility] 12/11/10  .  Hypertension   . Diabetes mellitus without complication   . Stroke   . CHF (congestive heart failure)     Social History Social History  Substance Use Topics  . Smoking status: Former Smoker    Quit date: 05/12/2004  . Smokeless tobacco: Never Used     Comment: quit 2005  . Alcohol Use: Yes     Comment: occasionally    Family History Family History  Problem Relation Age of Onset  . Lung cancer Mother   . Cancer Mother     Lung  . Diabetes Father     a fib also    Past Surgical History  Procedure Laterality Date  . Adenoidectomy    . Breast lumpectomy      benign  . Knee cartilage surgery    . Intraoperative arteriogram      for [facility] in L leg [facility]  . Endovascular stents      [facility]  . Femoral bypass  12/11/10    [facility]  . Cardioversion N/A 04/27/2013    Procedure: CARDIOVERSION;  Surgeon: Wendall StadePeter C Nishan, MD;  [facility];  Service: Cardiovascular;  Laterality: N/A;  . Cardioversion N/A 05/24/2013    Procedure: CARDIOVERSION;  Surgeon: Pricilla RifflePaula V Ross, MD;  [facility];  Service: Cardiovascular;  Laterality: N/A;  . Cardioversion N/A 05/31/2013    Procedure: CARDIOVERSION;  Surgeon: Hillis RangeJames Allred, MD;  [facility];  Service: Cardiovascular;  Laterality: N/A;    No Known  Allergies  Current Outpatient Prescriptions  Medication Sig Dispense Refill  . amiodarone (PACERONE) 100 MG tablet Take 1 tablet (100 mg total) by mouth daily. 90 tablet 3  . ELIQUIS 5 MG TABS tablet TAKE 1 TABLET TWICE A DAY 180 tablet 1  . furosemide (LASIX) 40 MG tablet Take 1 tablet (40 mg total) by mouth daily. 90 tablet 3  . hydrOXYzine (ATARAX/VISTARIL) 25 MG tablet Take 25 mg by mouth 4 (four) times daily.    [name] levothyroxine (SYNTHROID, LEVOTHROID) 75 MCG tablet Take 75 mcg by mouth daily before breakfast.    . metFORMIN (GLUCOPHAGE) 500 MG tablet Take 2 tablets (1,000 mg total) by mouth 2 (two) times daily with a meal. 360 tablet 3  .  metoprolol succinate (TOPROL-XL) 50 MG 24 hr tablet Take 1 tablet (50 mg total) by mouth daily. 90 tablet 3  . Multiple Vitamins-Minerals (ICAPS PO) Take 1 tablet by mouth daily.    . rosuvastatin (CRESTOR) 10 MG tablet Take 1 tablet (10 mg total) by mouth daily. 90 tablet 3   No current facility-administered medications for this visit.    ROS: See HPI for pertinent positives and negatives.   Physical Examination  Filed Vitals:   07/12/15 1312  BP: 111/69  Pulse: 66  Temp: 97.1 F (36.2 C)  TempSrc: Oral  Resp: 16  Height: 5\' 3"  (1.6 m)  Weight: 205 lb (92.987 kg)  SpO2: 97%   Body mass index is 36.32 kg/(m^2).   General: A&O x 3, WDWN, obese female. Gait: normal Eyes: PERRLA. Pulmonary: CTAB, no wheezes, rales or rhonchi. Cardiac: regular rhythm, no detected murmur.     Carotid Bruits Right Left   Negative Negative  Aorta is not palpable. Radial pulses: 2+ palpable and =   VASCULAR EXAM: Extremities without ischemic changes  without Gangrene; without open wounds.     LE Pulses Right Left   FEMORAL 2+ palpable 2+ palpable    POPLITEAL not palpable  not palpable   POSTERIOR TIBIAL not palpable, triphasic waveforms  not palpable, triphasic waveforms     DORSALIS PEDIS  ANTERIOR TIBIAL not palpable, triphasic waveforms  not palpable, triphasic waveforms    Abdomen: soft, NT, no palpated masses. Skin: no rashes, no ulcers. Musculoskeletal: no muscle wasting or atrophy. Neurologic: A&O X 3; Appropriate Affect ; SENSATION: normal; MOTOR FUNCTION: moving all extremities equally, motor strength 5/5 throughout. Speech is fluent/normal. CN 2-12 intact.           Non-Invasive Vascular Imaging: DATE: 07/12/2015  DUPLEX SCAN OF RIGHT LE  BYPASS: widely patent graft with mild disease in the native SFA origin.    ABI (Date: 07/12/2015)  R: 1.23 (1.05, 05/12/14), DP: tripasic, PT: triphasic, TBI: 0.67  L: >1.3 (1.08), DP: triphasic, PT: triphasic, TBI: 0.82   ASSESSMENT: Sherry Vargas is a 75 y.o. female who is s/p right femoral to popliteal artery bypass graft in June 2012([facility]); angioplasty left lower extremity ([place])01/2003. Currently she has no claudication symptoms with walking and no tissue loss.  OA pain in her knees limits her walking; states she will try to get back into water aerobics.  Her atherosclerotic risk factors include almost in control DM, former smoker, and obesity. She takes Eliquis for controlled atrial fib. She also takes a statin.  Today's right LE arterial duplex suggests a widely patent graft with mild disease in the native SFA origin.    ABI's remain normal with al;l triphasic waveforms.   PLAN:  Based on the  patient's vascular studies and examination, pt will return to clinic in 1 year for ABI's and right LE arterial duplex.  I discussed in depth with the patient the nature of atherosclerosis, and emphasized the importance of maximal medical management including strict control of blood pressure, blood glucose, and lipid levels, obtaining regular exercise, and continued cessation of smoking.  The patient is aware that without maximal medical management the underlying atherosclerotic disease process will progress, limiting the benefit of any interventions.  The patient was given information about PAD including signs, symptoms, treatment, what symptoms should prompt the patient to seek immediate medical care, and risk reduction measures to take.  Charisse March, RN, MSN, FNP-C [facility] [place] Phone: [phone]  Clinic MD: Darrick Penna  07/12/2015 1:13 PM

## 2015-08-31 ENCOUNTER — Ambulatory Visit: Payer: Medicare Other | Admitting: Podiatry

## 2015-09-08 ENCOUNTER — Ambulatory Visit (INDEPENDENT_AMBULATORY_CARE_PROVIDER_SITE_OTHER): Payer: Medicare Other | Admitting: Podiatry

## 2015-09-08 ENCOUNTER — Encounter: Payer: Self-pay | Admitting: Podiatry

## 2015-09-08 VITALS — Ht 63.0 in | Wt 200.0 lb

## 2015-09-08 DIAGNOSIS — B351 Tinea unguium: Secondary | ICD-10-CM

## 2015-09-08 DIAGNOSIS — M79676 Pain in unspecified toe(s): Secondary | ICD-10-CM

## 2015-09-08 DIAGNOSIS — M2041 Other hammer toe(s) (acquired), right foot: Secondary | ICD-10-CM | POA: Diagnosis not present

## 2015-09-08 NOTE — Progress Notes (Signed)
   Subjective:    Patient ID: Sherry Vargas, female    DOB: [birth year], 75 y.o.   MRN: [id number]  HPI this patient presents to the office with chief complaint of a painful area on the outside nail fifth toe right foot. She states that she injured her fifth toe 4 weeks ago and pain has been present ever since the injury. She has provided no self treatment for this painful fifth toe. She also says that she has thick and long, great toenails on both feet that are painful walking and wearing her shoes. She says she is diabetic with peripheral vascular disease. She presents the office today for an evaluation and treatment of her feet    Review of Systems  Cardiovascular: Positive for leg swelling.  Skin:       Extreme dryness of skin  Allergic/Immunologic: Positive for environmental allergies.  Neurological: Positive for light-headedness and numbness.       Objective:   Physical Exam GENERAL APPEARANCE: Alert, conversant. Appropriately groomed. No acute distress.  VASCULAR: Pedal pulses are not  palpable at  [facility] bilateral.  Capillary refill time is immediate to all digits,  Normal temperature gradient.    NEUROLOGIC: sensation is normal to 5.07 monofilament at 5/5 sites bilateral.  Light touch is intact bilateral, Muscle strength normal.  MUSCULOSKELETAL: acceptable muscle strength, tone and stability bilateral.  Intrinsic muscluature intact bilateral.  Rectus appearance of foot and digits noted bilateral. Adducto-varus fifth toe right foot.    DERMATOLOGIC: skin color, texture, and turgor are within normal limits.  No preulcerative lesions or ulcers  are seen, no interdigital maceration noted.  No open lesions present.  Digital nails are asymptomatic. No drainage noted. Listers corn lateral aspect fifth toe right foot.         Assessment & Plan:  Onychomycosis Hallux B/L   Listers corn fifth toe right foot.  IE  Debride nails hallux B/L.  Debridement of listers corn.  RTC 3  months.   Helane GuntherGregory Lorenza Winkleman DPM

## 2015-09-27 ENCOUNTER — Encounter (HOSPITAL_COMMUNITY): Payer: Self-pay | Admitting: *Deleted

## 2015-10-03 ENCOUNTER — Other Ambulatory Visit: Payer: Self-pay | Admitting: Internal Medicine

## 2015-10-11 ENCOUNTER — Telehealth: Payer: Self-pay | Admitting: Internal Medicine

## 2015-10-11 NOTE — Telephone Encounter (Signed)
[facility]ic[facility] for [facility] that we do not follow pt for CHF and by last echo her EF was 60%.  She was stable at last OV from a CHF standpoint  Has diastolic dysfunction.  She may call me back to discuss further if needed.

## 2015-10-11 NOTE — Telephone Encounter (Signed)
New message    [facility] is monitor by heart failure program .   Fax sent over today    Pt c/o swelling: STAT is pt has developed SOB within 24 hours  1. How long have you been experiencing swelling? X 3 days according to heart failure monitor  program    2. Where is the swelling located? Lower extremities - both   3.  Are you currently taking a "fluid pill"? yes -40 mg   4.  Are you currently SOB? No   5.  Have you traveled recently? no

## 2015-10-12 ENCOUNTER — Telehealth (HOSPITAL_COMMUNITY): Payer: Self-pay | Admitting: *Deleted

## 2015-10-12 NOTE — Telephone Encounter (Signed)
[facility] case manager concerned regarding patient reporting increased LLE edema that is not improving for last 4 days. Weights have been stable for last week according to pt. Has noticed a little shortness of breath but no chest pain or orthopnea.  States swelling normally resolves by morning with sleep but has not been the case the last 4 days. Discussed with Rudi Cocoonna Carroll NP will add 20mg  of lasix in the PM for the next 3 days. Appt made for 4/18 for follow up.

## 2015-10-17 ENCOUNTER — Encounter (HOSPITAL_COMMUNITY): Payer: Self-pay | Admitting: Nurse Practitioner

## 2015-10-17 ENCOUNTER — Ambulatory Visit (HOSPITAL_COMMUNITY)
Admission: RE | Admit: 2015-10-17 | Discharge: 2015-10-17 | Disposition: A | Discharge status: Home / Self Care | Payer: Medicare Other | Source: Ambulatory Visit | Attending: Nurse Practitioner | Admitting: Nurse Practitioner

## 2015-10-17 VITALS — BP 110/62 | HR 64 | Ht 63.0 in | Wt 205.8 lb

## 2015-10-17 DIAGNOSIS — I739 Peripheral vascular disease, unspecified: Secondary | ICD-10-CM | POA: Insufficient documentation

## 2015-10-17 DIAGNOSIS — Z87891 Personal history of nicotine dependence: Secondary | ICD-10-CM | POA: Insufficient documentation

## 2015-10-17 DIAGNOSIS — Z8673 Personal history of transient ischemic attack (TIA), and cerebral infarction without residual deficits: Secondary | ICD-10-CM | POA: Insufficient documentation

## 2015-10-17 DIAGNOSIS — Z79899 Other long term (current) drug therapy: Secondary | ICD-10-CM | POA: Diagnosis not present

## 2015-10-17 DIAGNOSIS — Z7901 Long term (current) use of anticoagulants: Secondary | ICD-10-CM | POA: Insufficient documentation

## 2015-10-17 DIAGNOSIS — I481 Persistent atrial fibrillation: Secondary | ICD-10-CM

## 2015-10-17 DIAGNOSIS — I110 Hypertensive heart disease with heart failure: Secondary | ICD-10-CM | POA: Insufficient documentation

## 2015-10-17 DIAGNOSIS — I4819 Other persistent atrial fibrillation: Secondary | ICD-10-CM

## 2015-10-17 DIAGNOSIS — Z7984 Long term (current) use of oral hypoglycemic drugs: Secondary | ICD-10-CM | POA: Insufficient documentation

## 2015-10-17 DIAGNOSIS — I4891 Unspecified atrial fibrillation: Secondary | ICD-10-CM | POA: Diagnosis present

## 2015-10-17 DIAGNOSIS — E119 Type 2 diabetes mellitus without complications: Secondary | ICD-10-CM | POA: Insufficient documentation

## 2015-10-17 DIAGNOSIS — E785 Hyperlipidemia, unspecified: Secondary | ICD-10-CM | POA: Diagnosis not present

## 2015-10-17 DIAGNOSIS — Z0189 Encounter for other specified special examinations: Secondary | ICD-10-CM | POA: Insufficient documentation

## 2015-10-17 DIAGNOSIS — I509 Heart failure, unspecified: Secondary | ICD-10-CM | POA: Insufficient documentation

## 2015-10-17 MED ORDER — METOPROLOL SUCCINATE ER 50 MG PO TB24: 50.0000 mg | ORAL_TABLET | Freq: Every day | ORAL | Status: DC

## 2015-10-18 ENCOUNTER — Encounter (HOSPITAL_COMMUNITY): Payer: Self-pay | Admitting: Nurse Practitioner

## 2015-10-18 NOTE — Progress Notes (Signed)
Patient ID: Nyoka Lint, female   DOB: 1941-02-17, 75 y.o.   MRN: [id number]     Primary Care Physician: Karie Chimera, MD Referring Physician:Dr.Allred   WAKISHA ALBERTS is a 75 y.o. female with a h/o atrial fibrillation maintaining SR on amiodarone. The dose was decreased to 100 mg daily and this appears to be keeping her in rhythm.TSH was elevated and PCP started her on thyroid replacement. She feels well and is not noticing any afib.  No bleeding issues with DOAC and taking regularly. She called the office last week because her insurance office prompted her to due to increase of weight x one week, mostly LLE and mostly rt leg. Since she increased lasix to 40 am and 20 mg pm, her swelling has improved but her weight is unchanged at 205 lbs. No shortness of breath. She does have PAD and has had a fem/pop bypass of her rt leg, not too long ago evaluated by vascular. She does f/u with her PCP later this afternoon and is expecting a blood draw.  Today, she denies symptoms of palpitations, chest pain, shortness of breath, orthopnea, PND, lower extremity edema, dizziness, presyncope, syncope, or neurologic sequela. The patient is tolerating medications without difficulties and is otherwise without complaint today.   Past Medical History  Diagnosis Date  . HLD (hyperlipidemia)   . DM (dermatomyositis)   . PAD (peripheral artery disease) (HCC)   . 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 Paroxysmal a-fib (HCC)     post operative following fem bypass at [facility] 12/11/10  . Hypertension   . Diabetes mellitus without complication (HCC)   . Stroke (HCC)   . CHF (congestive heart failure) [facility]    Past Surgical History  Procedure Laterality Date  . Adenoidectomy    . Breast lumpectomy      benign  . Knee cartilage surgery    . Intraoperative arteriogram      for claudication in L leg [facility]  . Endovascular stents      R leg at [facility]  . Femoral bypass  12/11/10    [facility]  . Cardioversion N/A 04/27/2013    Procedure: CARDIOVERSION;  Surgeon: Wendall Stade, MD;  Location[facility] ENDOSCOPY;  Service: Cardiovascular;  Laterality: N/A;  . Cardioversion N/A 05/24/2013    Procedure: CARDIOVERSION;  Surgeon: Pricilla Riffle, MD;  Location: [facility] ENDOSCOPY;  Service: Cardiovascular;  Laterality: N/A;  . Cardioversion N/A 05/31/2013    Procedure: CARDIOVERSION;  Surgeon: Hillis Range, MD;  Location: [facility] OR;  Service: Cardiovascular;  Laterality: N/A;    Current Outpatient Prescriptions  Medication Sig Dispense Refill  . ACCU-CHEK SMARTVIEW test strip 2 (two) times daily. use for testing  3  . amiodarone (PACERONE) 100 MG tablet TAKE 1 TABLET DAILY 90 tablet 1  . diphenhydramine-acetaminophen (TYLENOL PM) 25-500 MG TABS tablet Take 1 tablet by mouth as needed.    [name] ELIQUIS 5 MG TABS tablet TAKE 1 TABLET TWICE A DAY 180 tablet 1  . furosemide (LASIX) 40 MG tablet Take 1 tablet (40 mg total) by mouth daily. 90 tablet 3  . hydrOXYzine (ATARAX/VISTARIL) 25 MG tablet Take 25 mg by mouth every 4 (four) hours as needed.     [name] levothyroxine (SYNTHROID, LEVOTHROID) 50 MCG tablet Take 50 mcg by mouth daily before breakfast.     . metFORMIN (GLUCOPHAGE) 500 MG tablet Take 2 tablets (1,000 mg total) by mouth 2 (two) times daily with  a meal. 360 tablet 3  . metoprolol succinate (TOPROL-XL) 50 MG 24 hr tablet Take 1 tablet (50 mg total) by mouth daily. 90 tablet 3  . Multiple Vitamins-Minerals (ICAPS PO) Take 1 tablet by mouth daily.    . rosuvastatin (CRESTOR) 10 MG tablet Take 1 tablet (10 mg total) by mouth daily. 90 tablet 3  . TOUJEO SOLOSTAR 300 UNIT/ML SOPN      No current facility-administered medications for this encounter.    No Known Allergies  Social History   Social History  . Marital Status: Single    Spouse Name: N/A  . Number of Children: N/A  . Years of Education: N/A   Occupational History  . Not on file.   Social History Main Topics  . Smoking status:  Former Smoker    Quit date: 05/12/2004  . Smokeless tobacco: Never Used     Comment: quit 2005  . Alcohol Use: Yes     Comment: occasionally  . Drug Use: No  . Sexual Activity: Not on file   Other Topics Concern  . Not on file   Social History Narrative   [place] in [place]   Retired Product/process development scientistcomputer tech support    Family History  Problem Relation Age of Onset  . Lung cancer Mother   . Cancer Mother     Lung  . Diabetes Father     a fib also    ROS- All systems are reviewed and negative except as per the HPI above  Physical Exam: Filed Vitals:   10/17/15 1351  BP: 110/62  Pulse: 64  Height: 5\' 3"  (1.6 m)  Weight: 205 lb 12.8 oz (93.35 kg)    GEN- The patient is well appearing, alert and oriented x 3 today.   Head- normocephalic, atraumatic Eyes-  Sclera clear, conjunctiva pink Ears- hearing intact Oropharynx- clear Neck- supple, no JVP Lymph- no cervical lymphadenopathy Lungs- Clear to ausculation bilaterally, normal work of breathing Heart- Regular rate and rhythm, no murmurs, rubs or gallops, PMI not laterally displaced GI- soft, NT, ND, + BS Extremities- no clubbing, cyanosis,  Edema 2+ rt LE, trace left LE MS- no significant deformity or atrophy Skin- no rash or lesion Psych- euthymic mood, full affect Neuro- strength and sensation are intact  EKG-SR at 64 bpm, PR 144 ms, QRS 72 ms,Qtc 445 ms. Epic records reviewed  Assessment and Plan:  1. Afib Maintaining SR on 100 mg amiodarone Continue Eliquis 5 mg bid  2. Recent weight gain with LEE Rt leg larger that left Improved since increase in lasix to 40/20 daily Unclear to etiology, maintaining SR. Will see PCP this pm and anticipating blood draw, if renal function ok can probably continue lasix at current dose However, with rt leg larger than right, and known PAD, question if she should be rechecked by vascular She currently denies any pain in calf, no redness, Has not missed any  doses of eliquis Will also defer to PCP evaluation and necessary f/u of rt leg  2. HTN Stable  3.Prior CVA Continue DOAC  F/u in afib clinic in 3 months  Lupita LeashDonna C. Matthew Folksarroll, ANP-C [facility] [address] [place], [place] [address] [phone]

## 2016-01-16 ENCOUNTER — Ambulatory Visit (HOSPITAL_COMMUNITY): Payer: Medicare Other | Admitting: Nurse Practitioner

## 2016-01-18 ENCOUNTER — Ambulatory Visit (HOSPITAL_COMMUNITY)
Admission: RE | Admit: 2016-01-18 | Discharge: 2016-01-18 | Disposition: A | Discharge status: Home / Self Care | Payer: Medicare Other | Source: Ambulatory Visit | Attending: Nurse Practitioner | Admitting: Nurse Practitioner

## 2016-01-18 ENCOUNTER — Other Ambulatory Visit: Payer: Self-pay

## 2016-01-18 ENCOUNTER — Encounter (HOSPITAL_COMMUNITY): Payer: Self-pay | Admitting: Nurse Practitioner

## 2016-01-18 VITALS — BP 118/64 | HR 69 | Ht 63.0 in | Wt 202.6 lb

## 2016-01-18 DIAGNOSIS — E785 Hyperlipidemia, unspecified: Secondary | ICD-10-CM | POA: Diagnosis not present

## 2016-01-18 DIAGNOSIS — Z8673 Personal history of transient ischemic attack (TIA), and cerebral infarction without residual deficits: Secondary | ICD-10-CM | POA: Insufficient documentation

## 2016-01-18 DIAGNOSIS — Z79899 Other long term (current) drug therapy: Secondary | ICD-10-CM | POA: Insufficient documentation

## 2016-01-18 DIAGNOSIS — I4819 Other persistent atrial fibrillation: Secondary | ICD-10-CM

## 2016-01-18 DIAGNOSIS — I509 Heart failure, unspecified: Secondary | ICD-10-CM | POA: Diagnosis not present

## 2016-01-18 DIAGNOSIS — E119 Type 2 diabetes mellitus without complications: Secondary | ICD-10-CM | POA: Diagnosis not present

## 2016-01-18 DIAGNOSIS — I481 Persistent atrial fibrillation: Secondary | ICD-10-CM | POA: Insufficient documentation

## 2016-01-18 DIAGNOSIS — Z7901 Long term (current) use of anticoagulants: Secondary | ICD-10-CM | POA: Insufficient documentation

## 2016-01-18 DIAGNOSIS — Z7984 Long term (current) use of oral hypoglycemic drugs: Secondary | ICD-10-CM | POA: Insufficient documentation

## 2016-01-18 DIAGNOSIS — I110 Hypertensive heart disease with heart failure: Secondary | ICD-10-CM | POA: Insufficient documentation

## 2016-01-18 NOTE — Progress Notes (Signed)
Patient ID: Sherry Vargas, female   DOB: 12-01-1940, 75 y.o.   MRN: [id number]     Primary Care Physician: Karie ChimeraEESE,BETTI D, MD Referring Physician:Dr.Allred   Sherry Lintarol A Hopke is a 75 y.o. female with a h/o persisitent atrial fibrillation maintaining SR on amiodarone. The dose was decreased to 100 mg daily and this appears to be keeping her in rhythm.TSH was elevated and PCP started her on thyroid replacement this past fall . She feels well and is not noticing any afib.  No bleeding issues with DOAC and taking regularly.  Today, she denies symptoms of palpitations, chest pain, shortness of breath, orthopnea, PND, lower extremity edema, dizziness, presyncope, syncope, or neurologic sequela. The patient is tolerating medications without difficulties and is otherwise without complaint today.   Past Medical History  Diagnosis Date  . HLD (hyperlipidemia)   . DM (dermatomyositis)   . PAD (peripheral artery disease) (HCC)   . 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 Paroxysmal a-fib (HCC)     [facility]  . Hypertension   . Diabetes mellitus without complication (HCC)   . Stroke (HCC)   . CHF (congestive heart failure) [facility](HCC)    Past Surgical History  Procedure Laterality Date  . Adenoidectomy    . Breast lumpectomy      benign  . Knee cartilage surgery    . Intraoperative arteriogram      for [facility] in L leg [facility]  . Endovascular stents      [facility]  . Femoral bypass  12/11/10    [facility]  . Cardioversion N/A 04/27/2013    Procedure: CARDIOVERSION;  Surgeon: Wendall StadePeter C Nishan, MD;  [facility];  Service: Cardiovascular;  Laterality: N/A;  . Cardioversion N/A 05/24/2013    Procedure: CARDIOVERSION;  Surgeon: Pricilla RifflePaula V Ross, MD;  [facility];  Service: Cardiovascular;  Laterality: N/A;  . Cardioversion N/A 05/31/2013    Procedure: CARDIOVERSION;  Surgeon: Hillis RangeJames Allred, MD;  [facility];   Service: Cardiovascular;  Laterality: N/A;    Current Outpatient Prescriptions  Medication Sig Dispense Refill  . ACCU-CHEK SMARTVIEW test strip 2 (two) times daily. use for testing  3  . amiodarone (PACERONE) 100 MG tablet TAKE 1 TABLET DAILY 90 tablet 1  . diphenhydramine-acetaminophen (TYLENOL PM) 25-500 MG TABS tablet Take 1 tablet by mouth as needed.    [name]. ELIQUIS 5 MG TABS tablet TAKE 1 TABLET TWICE A DAY 180 tablet 1  . furosemide (LASIX) 40 MG tablet Take 1 tablet (40 mg total) by mouth daily. 90 tablet 3  . hydrOXYzine (ATARAX/VISTARIL) 25 MG tablet Take 25 mg by mouth every 4 (four) hours as needed.     [name]. levothyroxine (SYNTHROID, LEVOTHROID) 50 MCG tablet Take 50 mcg by mouth daily before breakfast.     . metFORMIN (GLUCOPHAGE) 500 MG tablet Take 2 tablets (1,000 mg total) by mouth 2 (two) times daily with a meal. 360 tablet 3  . metoprolol succinate (TOPROL-XL) 50 MG 24 hr tablet Take 1 tablet (50 mg total) by mouth daily. 90 tablet 3  . Multiple Vitamins-Minerals (ICAPS PO) Take 1 tablet by mouth daily.    . rosuvastatin (CRESTOR) 10 MG tablet Take 1 tablet (10 mg total) by mouth daily. 90 tablet 3   No current facility-administered medications for this encounter.    No Known Allergies  Social History   Social History  . Marital  Status: Single    Spouse Name: N/A  . Number of Children: N/A  . Years of Education: N/A   Occupational History  . Not on file.   Social History Main Topics  . Smoking status: Former Smoker    Quit date: 05/12/2004  . Smokeless tobacco: Never Used     Comment: quit 2005  . Alcohol Use: Yes     Comment: occasionally  . Drug Use: No  . Sexual Activity: Not on file   Other Topics Concern  . Not on file   Social History Narrative   Lives in [place], near [place].   Retired Product/process development scientist    Family History  Problem Relation Age of Onset  . Lung cancer Mother   . Cancer Mother     Lung  . Diabetes Father     a fib  also    ROS- All systems are reviewed and negative except as per the HPI above  Physical Exam: Filed Vitals:   01/18/16 1528  BP: 118/64  Pulse: 69  Height:  (1.6 m)  Weight: 202 lb 9.6 oz (91.899 kg)    GEN- The patient is well appearing, alert and oriented x 3 today.   Head- normocephalic, atraumatic Eyes-  Sclera clear, conjunctiva pink Ears- hearing intact Oropharynx- clear Neck- supple, no JVP Lymph- no cervical lymphadenopathy Lungs- Clear to ausculation bilaterally, normal work of breathing Heart- Regular rate and rhythm, no murmurs, rubs or gallops, PMI not laterally displaced GI- soft, NT, ND, + BS Extremities- no clubbing, cyanosis, or edema MS- no significant deformity or atrophy Skin- no rash or lesion Psych- euthymic mood, full affect Neuro- strength and sensation are intact  EKG-SR at 69 bpm, PR 140 ms, QRS 78 ms,Qtc 457 ms. Epic records reviewed  Assessment and Plan:  1. Afib Maintaining SR on 100 mg amiodarone Cmet,thyroid panel checked in April and reviewed Continue Eliquis  2. HTN Stable  3.Prior CVA Continue DOAC  F/u in afib clinic in 3 months  Lupita Leash C. Matthew Folks Afib Clinic [facility] [address] [place], [place] [address] [phone]

## 2016-03-25 ENCOUNTER — Telehealth (HOSPITAL_COMMUNITY): Payer: Self-pay | Admitting: *Deleted

## 2016-03-25 MED ORDER — FUROSEMIDE 40 MG PO TABS: ORAL_TABLET | ORAL | 2 refills | Status: AC

## 2016-03-26 NOTE — Telephone Encounter (Signed)
Late entry - 9/25 @ 1700 Pt called in stating she is having issues with lower extremity swelling and weight gain. Discussed with Rudi Cocoonna Carroll NP recommended increasing lasix to 60mg  for the next 3 days then returning to normal dosing of lasix 40mg  daily. Pt can take extra 20mg  of lasix as needed for lower extremity swelling and weight gain of >3lbs. Instructed to call if she is frequently having to take extra lasix as she will need to come in for BMET and discussion of permanent increase of lasix. Pt verbalized understanding and also encouraged to monitor sodium/fluid intake.

## 2016-04-18 ENCOUNTER — Ambulatory Visit (HOSPITAL_COMMUNITY)
Admission: RE | Admit: 2016-04-18 | Discharge: 2016-04-18 | Disposition: A | Discharge status: Home / Self Care | Payer: Medicare Other | Source: Ambulatory Visit | Attending: Nurse Practitioner | Admitting: Nurse Practitioner

## 2016-04-18 ENCOUNTER — Encounter (HOSPITAL_COMMUNITY): Payer: Self-pay | Admitting: Nurse Practitioner

## 2016-04-18 VITALS — BP 116/64 | HR 72 | Ht 63.0 in | Wt 204.0 lb

## 2016-04-18 DIAGNOSIS — M3313 Other dermatomyositis without myopathy: Secondary | ICD-10-CM | POA: Insufficient documentation

## 2016-04-18 DIAGNOSIS — I4891 Unspecified atrial fibrillation: Secondary | ICD-10-CM | POA: Insufficient documentation

## 2016-04-18 DIAGNOSIS — I110 Hypertensive heart disease with heart failure: Secondary | ICD-10-CM | POA: Insufficient documentation

## 2016-04-18 DIAGNOSIS — Z87891 Personal history of nicotine dependence: Secondary | ICD-10-CM | POA: Insufficient documentation

## 2016-04-18 DIAGNOSIS — Z79899 Other long term (current) drug therapy: Secondary | ICD-10-CM | POA: Insufficient documentation

## 2016-04-18 DIAGNOSIS — Z794 Long term (current) use of insulin: Secondary | ICD-10-CM | POA: Diagnosis not present

## 2016-04-18 DIAGNOSIS — Z9889 Other specified postprocedural states: Secondary | ICD-10-CM | POA: Insufficient documentation

## 2016-04-18 DIAGNOSIS — E785 Hyperlipidemia, unspecified: Secondary | ICD-10-CM | POA: Diagnosis not present

## 2016-04-18 DIAGNOSIS — I481 Persistent atrial fibrillation: Secondary | ICD-10-CM | POA: Diagnosis not present

## 2016-04-18 DIAGNOSIS — Z7901 Long term (current) use of anticoagulants: Secondary | ICD-10-CM | POA: Diagnosis not present

## 2016-04-18 DIAGNOSIS — I4819 Other persistent atrial fibrillation: Secondary | ICD-10-CM

## 2016-04-18 DIAGNOSIS — E119 Type 2 diabetes mellitus without complications: Secondary | ICD-10-CM | POA: Insufficient documentation

## 2016-04-18 DIAGNOSIS — Z8673 Personal history of transient ischemic attack (TIA), and cerebral infarction without residual deficits: Secondary | ICD-10-CM | POA: Insufficient documentation

## 2016-04-18 MED ORDER — APIXABAN 5 MG PO TABS: 5.0000 mg | ORAL_TABLET | Freq: Two times a day (BID) | ORAL | 3 refills | Status: AC

## 2016-04-18 MED ORDER — AMIODARONE HCL 100 MG PO TABS: 100.0000 mg | ORAL_TABLET | Freq: Every day | ORAL | 3 refills | Status: AC

## 2016-04-18 NOTE — Progress Notes (Signed)
Patient ID: Sherry Vargas, female   DOB: 24-May-1941, 75 y.o.   MRN: [id number]     Primary Care Physician: Karie ChimeraEESE,BETTI D, MD Referring Physician:Dr.Allred   Sherry Lintarol A Denson is a 75 y.o. female with a h/o persisitent atrial fibrillation maintaining SR on amiodarone. The dose was decreased to 100 mg daily and this appears to be keeping her in rhythm.TSH was elevated and PCP started her on thyroid replacement.. She feels well and is not noticing any afib.  No bleeding issues with DOAC and taking regularly. She had labs drawn with PCP 5 weeks ago and will have labs faxed to office.  Today, she denies symptoms of palpitations, chest pain, shortness of breath, orthopnea, PND, lower extremity edema, dizziness, presyncope, syncope, or neurologic sequela. The patient is tolerating medications without difficulties and is otherwise without complaint today.   Past Medical History:  Diagnosis Date  . CHF (congestive heart failure) (HCC)   . Diabetes mellitus without complication (HCC)   . DM (dermatomyositis)   . HLD (hyperlipidemia)   . Hypertension   . 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 PAD (peripheral artery disease) (HCC)   . Paroxysmal a-fib (HCC)    [facility]  . [facility])    Past Surgical History:  Procedure Laterality Date  . ADENOIDECTOMY    . BREAST LUMPECTOMY     benign  . CARDIOVERSION N/A 04/27/2013   Procedure: CARDIOVERSION;  Surgeon: Wendall StadePeter C Nishan, MD;  [facility];  Service: Cardiovascular;  Laterality: N/A;  . CARDIOVERSION N/A 05/24/2013   Procedure: CARDIOVERSION;  Surgeon: Pricilla RifflePaula V Ross, MD;  [facility];  Service: Cardiovascular;  Laterality: N/A;  . CARDIOVERSION N/A 05/31/2013   Procedure: CARDIOVERSION;  Surgeon: Hillis RangeJames Allred, MD;  [facility];  Service: Cardiovascular;  Laterality: N/A;  . endovascular stents     [facility]  . FEMORAL BYPASS  12/11/10   [facility]  .  INTRAOPERATIVE ARTERIOGRAM     for [facility]aud[facility] in L leg [facility]  . KNEE CARTILAGE SURGERY      Current Outpatient Prescriptions  Medication Sig Dispense Refill  . ACCU-CHEK SMARTVIEW test strip 2 (two) times daily. use for testing  3  . amiodarone (PACERONE) 100 MG tablet Take 1 tablet (100 mg total) by mouth daily. 90 tablet 3  . apixaban (ELIQUIS) 5 MG TABS tablet Take 1 tablet (5 mg total) by mouth 2 (two) times daily. 180 tablet 3  . diphenhydramine-acetaminophen (TYLENOL PM) 25-500 MG TABS tablet Take 1 tablet by mouth as needed.    . furosemide (LASIX) 40 MG tablet Take 1 tablet (40mg ) by mouth daily. May take extra 1/2 tablet (20mg ) as needed for swelling or weight gain per instruction 120 tablet 2  . Insulin Glargine (TOUJEO SOLOSTAR) 300 UNIT/ML SOPN Inject 30 Units into the skin at bedtime.    [name]. levothyroxine (SYNTHROID, LEVOTHROID) 50 MCG tablet Take 50 mcg by mouth daily before breakfast.     . metFORMIN (GLUCOPHAGE) 500 MG tablet Take 2 tablets (1,000 mg total) by mouth 2 (two) times daily with a meal. 360 tablet 3  . metoprolol succinate (TOPROL-XL) 50 MG 24 hr tablet Take 1 tablet (50 mg total) by mouth daily. 90 tablet 3  . Multiple Vitamins-Minerals (ICAPS PO) Take 1 tablet by mouth daily.    . rosuvastatin (CRESTOR) 10 MG tablet Take 1 tablet (10 mg total) by mouth daily. 90  tablet 3  . hydrOXYzine (ATARAX/VISTARIL) 25 MG tablet Take 25 mg by mouth every 4 (four) hours as needed.      No current facility-administered medications for this encounter.     No Known Allergies  Social History   Social History  . Marital status: Single    Spouse name: N/A  . Number of children: N/A  . Years of education: N/A   Occupational History  . Not on file.   Social History Main Topics  . Smoking status: Former Smoker    Quit date: 05/12/2004  . Smokeless tobacco: Never Used     Comment: quit 2005  . Alcohol use Yes     Comment: occasionally  . Drug use: No  . Sexual  activity: Not on file   Other Topics Concern  . Not on file   Social History Narrative   Lives in [place], near [place].   Retired Product/process development scientist    Family History  Problem Relation Age of Onset  . Lung cancer Mother   . Cancer Mother     Lung  . Diabetes Father     a fib also    ROS- All systems are reviewed and negative except as per the HPI above  Physical Exam: Vitals:   04/18/16 1602  BP: 116/64  Pulse: 72  Weight: 204 lb (92.5 kg)  Height: 5\' 3"  (1.6 m)    GEN- The patient is well appearing, alert and oriented x 3 today.   Head- normocephalic, atraumatic Eyes-  Sclera clear, conjunctiva pink Ears- hearing intact Oropharynx- clear Neck- supple, no JVP Lymph- no cervical lymphadenopathy Lungs- Clear to ausculation bilaterally, normal work of breathing Heart- regular rate and rhythm, no murmurs, rubs or gallops, PMI not laterally displaced GI- soft, NT, ND, + BS Extremities- no clubbing, cyanosis, or edema MS- no significant deformity or atrophy Skin- no rash or lesion Psych- euthymic mood, full affect Neuro- strength and sensation are intact  EKG-SR at 72 bpm, PR 144 ms, QRS 74 ms,Qtc 446 ms. Epic records reviewed  Assessment and Plan:  1. Afib Maintaining SR on 100 mg amiodarone Cmet,thyroid panel checked in April and recently checked with PCP, pt will have labs  faxed Continue Eliquis  2. HTN Stable  3.Prior CVA Continue DOAC  F/u in afib clinic in 6 months  Lupita Leash C. [facility] [address] [place], [place] [address] [phone]

## 2016-06-05 ENCOUNTER — Encounter: Payer: Self-pay | Admitting: Neurology

## 2016-06-05 ENCOUNTER — Ambulatory Visit (INDEPENDENT_AMBULATORY_CARE_PROVIDER_SITE_OTHER): Payer: Medicare Other | Admitting: Neurology

## 2016-06-05 VITALS — BP 108/62 | HR 65 | Ht 63.0 in | Wt 208.5 lb

## 2016-06-05 DIAGNOSIS — I481 Persistent atrial fibrillation: Secondary | ICD-10-CM | POA: Diagnosis not present

## 2016-06-05 DIAGNOSIS — R269 Unspecified abnormalities of gait and mobility: Secondary | ICD-10-CM

## 2016-06-05 DIAGNOSIS — R202 Paresthesia of skin: Secondary | ICD-10-CM | POA: Insufficient documentation

## 2016-06-05 DIAGNOSIS — I4819 Other persistent atrial fibrillation: Secondary | ICD-10-CM

## 2016-06-05 NOTE — Progress Notes (Signed)
PATIENT: Sherry Vargas DOB: October 17, 1940  Chief Complaint  Patient presents with  . Diabetic Neuropathy    She is here with her son, Sherry Vargas. Reports burning, tingling and pain in her right lower extremity that has progressively gotten worse over several years.  . Tremors    She has tremors in her hands that is worse on the left.  She likes to make jewelry and she has a hard time gripping the beads.  [name] PCP    Sherry Able, MD     HISTORICAL  Sherry Vargas is a 75 years old right-handed female, seen in refer by  her primary care doctor Sherry Vargas for evaluation of bilateral feet paresthesia, hand tremor, initial evaluation was on June 05 2016.  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  Tremor: She denies a family history of tremor, since 2015, she noticed intermittent posturing tremor, especially when holding the subject with her left hand, she denied loss sense of smell, no Schramm sleep disorder  Falling episode: She is very sedentary, really exercise, over the past 7 years, she has to falling episode, the most recent was in summer 2017, when she was bending down trying to break a tree branch, she fell backwards, needing assistant to get up from the ground  Feet numbness: She had a history of diabetes for over 20 years, around 2002, she began to have numbness at her foot, involving right on the left, bottom of her feet, extending to distal leg,  Laboratory evaluation reviewed, A1c on April 2017 was 10, reported significant improvement by November 2017 after starting insulin, it is now dropped to 6.7.  REVIEW OF SYSTEMS: Full 14 system review of systems performed and notable only for ringing ears, depression anxiety headache, swelling  legs, memory loss, confusion, weakness, numbness, insomnia, diarrhea constipation, joint pain itching  ALLERGIES: No Known Allergies  HOME MEDICATIONS: Current Outpatient Prescriptions  Medication Sig Dispense Refill  . ACCU-CHEK SMARTVIEW test strip 2 (two) times daily. use for testing  3  . amiodarone (PACERONE) 100 MG tablet Take 1 tablet (100 mg total) by mouth daily. 90 tablet 3  . apixaban (ELIQUIS) 5 MG TABS tablet Take 1 tablet (5 mg total) by mouth 2 (two) times daily. 180 tablet 3  . diphenhydramine-acetaminophen (TYLENOL PM) 25-500 MG TABS tablet Take 1 tablet by mouth as needed.    . furosemide (LASIX) 40 MG tablet Take 1 tablet (40mg ) by mouth daily. May take extra 1/2 tablet (20mg ) as needed for swelling or weight gain per instruction 120 tablet 2  . Insulin Glargine (TOUJEO SOLOSTAR) 300 UNIT/ML SOPN Inject 30 Units into the skin at bedtime.    [name] levothyroxine (SYNTHROID, LEVOTHROID) 50 MCG tablet Take 50 mcg by mouth daily before breakfast.     . metFORMIN (GLUCOPHAGE) 1000 MG tablet daily.    . metoprolol succinate (TOPROL-XL) 50 MG 24 hr tablet Take 1 tablet (50 mg total) by mouth daily. 90 tablet 3  . Multiple Vitamins-Minerals (ICAPS PO) Take 1 tablet by mouth daily.    . rosuvastatin (CRESTOR) 10 MG tablet Take 1 tablet (10 mg total) by mouth daily. 90 tablet 3   No current facility-administered medications for this visit.     PAST MEDICAL HISTORY: Past Medical History:  Diagnosis  Date  . CHF (congestive heart failure) (HCC)   . Diabetes mellitus without complication (HCC)   . DM (dermatomyositis)   . HLD (hyperlipidemia)   . Hypertension   . 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 Neuropathy (HCC)   . PAD (peripheral artery disease) (HCC)   . Paroxysmal a-fib (HCC)    [facility]  . Stroke (HCC)   . Tremors of nervous system     PAST SURGICAL HISTORY: Past Surgical History:  Procedure Laterality  Date  . ADENOIDECTOMY    . BREAST LUMPECTOMY     benign  . CARDIOVERSION N/A 04/27/2013   Procedure: CARDIOVERSION;  Surgeon: Wendall StadePeter C Nishan, MD;  [facility];  Service: Cardiovascular;  Laterality: N/A;  . CARDIOVERSION N/A 05/24/2013   Procedure: CARDIOVERSION;  Surgeon: Pricilla RifflePaula V Ross, MD;  [facility];  Service: Cardiovascular;  Laterality: N/A;  . CARDIOVERSION N/A 05/31/2013   Procedure: CARDIOVERSION;  Surgeon: Hillis RangeJames Allred, MD;  [facility];  Service: Cardiovascular;  Laterality: N/A;  . endovascular stents     [facility]  . FEMORAL BYPASS  12/11/10   [facility]  . INTRAOPERATIVE ARTERIOGRAM     [facility]  . KNEE CARTILAGE SURGERY      FAMILY HISTORY: Family History  Problem Relation Age of Onset  . Lung cancer Mother   . Pneumonia Mother   . Diabetes Father   . Atrial fibrillation Father   . Parkinson's disease Father   . Alcoholism Brother     SOCIAL HISTORY:  Social History   Social History  . Marital status: Single    Spouse name: N/A  . Number of children: 1  . Years of education: Masters   Occupational History  . Retired    Social History Main Topics  . Smoking status: Former Smoker    Quit date: 10/11/2003  . Smokeless tobacco: Never Used     Comment: quit 2005  . Alcohol use No     Comment: no drinking since 2005  . Drug use: No  . Sexual activity: Not on file   Other Topics Concern  . Not on file   Social History Narrative   [place]   Retired Product/process development scientistcomputer tech support.   Lives at home with son.   No caffeine use.   Right-handed.        PHYSICAL EXAM   Vitals:   06/05/16 1151  BP: 108/62  Pulse: 65  Weight: 208 lb 8 oz (94.6 kg)  Height: 5\' 3"  (1.6 m)    Not recorded      Body mass index is 36.93 kg/m.  PHYSICAL EXAMNIATION:  Gen: NAD, conversant, well nourised, obese, well groomed                     Cardiovascular: Regular rate  rhythm, no peripheral edema, warm, nontender. Eyes: Conjunctivae clear without exudates or hemorrhage Neck: Supple, no carotid bruits. Pulmonary: Clear to auscultation bilaterally   NEUROLOGICAL EXAM:  MENTAL STATUS: Speech:    Speech is normal; fluent and spontaneous with normal comprehension.  Cognition:     Orientation to time, place and person     Normal recent and remote memory     Normal Attention span and concentration     Normal Language, naming, repeating,spontaneous speech     Fund of knowledge  CRANIAL NERVES: CN II: Visual fields are full to confrontation. Fundoscopic exam is normal with sharp discs and no vascular changes. Pupils are round equal and briskly reactive to light. CN III, IV, VI: extraocular movement are normal. No ptosis. CN V: Facial sensation is intact to pinprick in all 3 divisions bilaterally. Corneal responses are intact.  CN VII: Face is symmetric with normal eye closure and smile. CN VIII: Hearing is normal to rubbing fingers CN IX, X: Palate elevates symmetrically. Phonation is normal. CN XI: Head turning and shoulder shrug are intact CN XII: Tongue is midline with normal movements and no atrophy.  MOTOR: There is no pronator drift of out-stretched arms. Muscle bulk and tone are normal. Muscle strength is normal.  REFLEXES: Reflexes are 2+ and symmetric at the biceps, triceps, knees, and ankles. Plantar responses are flexor.  SENSORY: Length dependence decreased to  light touch, pinprick to distal shin level, absent  vibratory sensation at toes   COORDINATION: Rapid alternating movements and fine finger movements are intact. There is no dysmetria on finger-to-nose and heel-knee-shin.    GAIT/STANCE: Posture is normal. Gait is steady with normal steps, base, arm swing, and turning. Heel and toe walking are normal. Tandem gait is normal.  Romberg is absent.   DIAGNOSTIC DATA (LABS, IMAGING, TESTING) - I reviewed patient records, labs,  notes, testing and imaging myself where available.   ASSESSMENT AND PLAN  Nyoka LintCarol A Baucom is a 75 y.o. female   Bilateral lower extremity paresthesia Most consistent with diabetic peripheral neuropathy EMG nerve conduction study Laboratory evaluation to rule out other etiology Balance issues: Multifactorial, related to peripheral neuropathy, obesity, deconditioning, Tremor: Most consistent with essential tremor, no parkinsonian features, will check TSH   Levert FeinsteinYijun Dimonique Bourdeau, M.D. Ph.D.  [facility] [address], [address] [place], [place] [address] Ph: [phone] Fax: [phone]  CC: Sherry AbleBetti Reese, MD

## 2016-06-06 ENCOUNTER — Telehealth: Payer: Self-pay | Admitting: Neurology

## 2016-06-06 DIAGNOSIS — E538 Deficiency of other specified B group vitamins: Secondary | ICD-10-CM | POA: Insufficient documentation

## 2016-06-06 NOTE — Telephone Encounter (Addendum)
Pt will come for repeat labs and start oral vitamin D3.  [place] and will be unable to set up injections with us or her PCP.  She is requesting an oral dosage for the B12.  I ask her which day she will be moving and she is unsure.  She will be here Monday for repeat labs and I will plan to give her an injection that day.  I told the pt we can give her an injection every day that she is still here and after that Dr. Terrace ArabiaYan will make a decision on how to address her B12 deficiency. She is agreeable to this plan.

## 2016-06-06 NOTE — Telephone Encounter (Signed)
Please call patient, laboratory showed vitamin B12 deficiency, level less than 150, she should come in for vitamin B12 IM supplement, 1000 g daily for one week, then once a week for one month, then monthly,  There is also evidence of vitamin D deficiency, she should take over-the-counter vitamin D3 supplement 1000 units daily.  Also needs to have repeat vitamin B12, methylmalonic acid level, folic acid, homocystine level before starting B12 supplement

## 2016-06-07 ENCOUNTER — Telehealth: Payer: Self-pay | Admitting: Neurology

## 2016-06-07 LAB — RPR: RPR Ser Ql: NONREACTIVE

## 2016-06-07 LAB — TSH: TSH: 0.112 u[IU]/mL — ABNORMAL LOW (ref 0.450–4.500)

## 2016-06-07 LAB — SEDIMENTATION RATE: SED RATE: 8 mm/h (ref 0–40)

## 2016-06-07 LAB — FOLATE: FOLATE: 13.8 ng/mL (ref >3.0)

## 2016-06-07 LAB — C-REACTIVE PROTEIN: CRP: 4.7 mg/L (ref 0.0–4.9)

## 2016-06-07 LAB — COPPER, SERUM: Copper: 150 ug/dL (ref 72–166)

## 2016-06-07 LAB — CK: Total CK: 50 U/L (ref 24–173)

## 2016-06-07 LAB — VITAMIN D 25 HYDROXY (VIT D DEFICIENCY, FRACTURES): Vit D, 25-Hydroxy: 21.8 ng/mL — ABNORMAL LOW (ref 30.0–100.0)

## 2016-06-07 LAB — FERRITIN: FERRITIN: 45 ng/mL (ref 15–150)

## 2016-06-07 LAB — VITAMIN B12: Vitamin B-12: <150 pg/mL — ABNORMAL LOW (ref 232–1245)

## 2016-06-07 MED ORDER — CYANOCOBALAMIN 2000 MCG PO TABS: 2000.0000 ug | ORAL_TABLET | Freq: Every day | ORAL | 11 refills | Status: AC

## 2016-06-07 NOTE — Telephone Encounter (Signed)
LMOM that YY has sent rx. for Vit. B12 to [facility] [place]

## 2016-06-07 NOTE — Telephone Encounter (Signed)
Please tell her that I have called in Vit B12 daily x 90 tablets x11 refill.

## 2016-06-10 ENCOUNTER — Other Ambulatory Visit (INDEPENDENT_AMBULATORY_CARE_PROVIDER_SITE_OTHER): Payer: Self-pay

## 2016-06-10 ENCOUNTER — Ambulatory Visit (INDEPENDENT_AMBULATORY_CARE_PROVIDER_SITE_OTHER): Payer: Self-pay | Admitting: *Deleted

## 2016-06-10 ENCOUNTER — Encounter: Payer: Self-pay | Admitting: *Deleted

## 2016-06-10 ENCOUNTER — Other Ambulatory Visit: Payer: Self-pay | Admitting: *Deleted

## 2016-06-10 ENCOUNTER — Encounter (INDEPENDENT_AMBULATORY_CARE_PROVIDER_SITE_OTHER): Payer: Medicare Other | Admitting: *Deleted

## 2016-06-10 DIAGNOSIS — E539 Vitamin B deficiency, unspecified: Secondary | ICD-10-CM

## 2016-06-10 DIAGNOSIS — D509 Iron deficiency anemia, unspecified: Secondary | ICD-10-CM

## 2016-06-10 DIAGNOSIS — E559 Vitamin D deficiency, unspecified: Secondary | ICD-10-CM

## 2016-06-10 DIAGNOSIS — E538 Deficiency of other specified B group vitamins: Secondary | ICD-10-CM

## 2016-06-10 DIAGNOSIS — Z0289 Encounter for other administrative examinations: Secondary | ICD-10-CM

## 2016-06-10 MED ORDER — CYANOCOBALAMIN 1000 MCG/ML IJ SOLN
1000.0000 ug | Freq: Every day | INTRAMUSCULAR | Status: AC
  Administered 2016-06-10 – 2016-06-13 (×4): 1000 ug via INTRAMUSCULAR

## 2016-06-11 ENCOUNTER — Encounter: Payer: Self-pay | Admitting: *Deleted

## 2016-06-11 ENCOUNTER — Ambulatory Visit (INDEPENDENT_AMBULATORY_CARE_PROVIDER_SITE_OTHER): Payer: Medicare Other | Admitting: *Deleted

## 2016-06-11 DIAGNOSIS — E538 Deficiency of other specified B group vitamins: Secondary | ICD-10-CM

## 2016-06-11 DIAGNOSIS — E539 Vitamin B deficiency, unspecified: Secondary | ICD-10-CM | POA: Diagnosis not present

## 2016-06-12 ENCOUNTER — Ambulatory Visit (INDEPENDENT_AMBULATORY_CARE_PROVIDER_SITE_OTHER): Payer: Medicare Other | Admitting: *Deleted

## 2016-06-12 DIAGNOSIS — E538 Deficiency of other specified B group vitamins: Secondary | ICD-10-CM | POA: Diagnosis not present

## 2016-06-12 DIAGNOSIS — E539 Vitamin B deficiency, unspecified: Secondary | ICD-10-CM | POA: Diagnosis not present

## 2016-06-13 ENCOUNTER — Ambulatory Visit (INDEPENDENT_AMBULATORY_CARE_PROVIDER_SITE_OTHER): Payer: Medicare Other | Admitting: *Deleted

## 2016-06-13 DIAGNOSIS — E539 Vitamin B deficiency, unspecified: Secondary | ICD-10-CM

## 2016-06-13 DIAGNOSIS — E538 Deficiency of other specified B group vitamins: Secondary | ICD-10-CM

## 2016-06-14 LAB — FOLATE: Folate: 7.8 ng/mL (ref >3.0)

## 2016-06-14 LAB — METHYLMALONIC ACID, SERUM: METHYLMALONIC ACID: 427 nmol/L — AB (ref 0–378)

## 2016-06-14 LAB — VITAMIN B12: Vitamin B-12: 499 pg/mL (ref 232–1245)

## 2016-06-14 LAB — HOMOCYSTEINE: HOMOCYSTEINE: 12 umol/L (ref 0.0–15.0)

## 2016-06-17 ENCOUNTER — Ambulatory Visit (INDEPENDENT_AMBULATORY_CARE_PROVIDER_SITE_OTHER): Payer: Medicare Other | Admitting: *Deleted

## 2016-06-17 DIAGNOSIS — E538 Deficiency of other specified B group vitamins: Secondary | ICD-10-CM

## 2016-06-17 MED ORDER — CYANOCOBALAMIN 1000 MCG/ML IJ SOLN
1000.0000 ug | Freq: Once | INTRAMUSCULAR | Status: AC
  Administered 2016-06-17: 1000 ug via INTRAMUSCULAR

## 2016-06-26 ENCOUNTER — Ambulatory Visit (INDEPENDENT_AMBULATORY_CARE_PROVIDER_SITE_OTHER): Payer: Medicare Other | Admitting: *Deleted

## 2016-06-26 DIAGNOSIS — E538 Deficiency of other specified B group vitamins: Secondary | ICD-10-CM

## 2016-06-26 MED ORDER — CYANOCOBALAMIN 1000 MCG/ML IJ SOLN
1000.0000 ug | Freq: Once | INTRAMUSCULAR | Status: AC
  Administered 2016-06-26: 1000 ug via INTRAMUSCULAR

## 2016-07-03 ENCOUNTER — Ambulatory Visit (INDEPENDENT_AMBULATORY_CARE_PROVIDER_SITE_OTHER): Payer: Medicare Other | Admitting: *Deleted

## 2016-07-03 DIAGNOSIS — E538 Deficiency of other specified B group vitamins: Secondary | ICD-10-CM

## 2016-07-03 DIAGNOSIS — E559 Vitamin D deficiency, unspecified: Secondary | ICD-10-CM

## 2016-07-03 MED ORDER — CYANOCOBALAMIN 1000 MCG/ML IJ SOLN
1000.0000 ug | Freq: Once | INTRAMUSCULAR | Status: AC
  Administered 2016-07-03: 1000 ug via INTRAMUSCULAR

## 2016-07-11 ENCOUNTER — Encounter (HOSPITAL_COMMUNITY): Payer: Medicare Other

## 2016-07-11 ENCOUNTER — Ambulatory Visit: Payer: Medicare Other | Admitting: Family

## 2016-07-11 ENCOUNTER — Other Ambulatory Visit (HOSPITAL_COMMUNITY): Payer: Medicare Other

## 2016-07-26 ENCOUNTER — Encounter: Payer: Medicare Other | Admitting: Neurology

## 2016-07-29 ENCOUNTER — Encounter: Payer: Self-pay | Admitting: Family

## 2016-08-02 ENCOUNTER — Encounter: Payer: Self-pay | Admitting: Family

## 2016-08-02 ENCOUNTER — Ambulatory Visit (HOSPITAL_COMMUNITY)
Admission: RE | Admit: 2016-08-02 | Discharge: 2016-08-02 | Disposition: A | Discharge status: Home / Self Care | Payer: Medicare Other | Source: Ambulatory Visit | Attending: Family | Admitting: Family

## 2016-08-02 ENCOUNTER — Encounter: Payer: Medicare Other | Admitting: Neurology

## 2016-08-02 ENCOUNTER — Ambulatory Visit (INDEPENDENT_AMBULATORY_CARE_PROVIDER_SITE_OTHER): Payer: Medicare Other | Admitting: Family

## 2016-08-02 ENCOUNTER — Ambulatory Visit (INDEPENDENT_AMBULATORY_CARE_PROVIDER_SITE_OTHER)
Admission: RE | Admit: 2016-08-02 | Discharge: 2016-08-02 | Disposition: A | Discharge status: Home / Self Care | Payer: Medicare Other | Source: Ambulatory Visit | Attending: Family | Admitting: Family

## 2016-08-02 VITALS — BP 122/66 | HR 63 | Temp 97.4°F | Resp 20 | Ht 63.0 in | Wt 207.6 lb

## 2016-08-02 DIAGNOSIS — Z87891 Personal history of nicotine dependence: Secondary | ICD-10-CM | POA: Diagnosis not present

## 2016-08-02 DIAGNOSIS — I779 Disorder of arteries and arterioles, unspecified: Secondary | ICD-10-CM | POA: Diagnosis not present

## 2016-08-02 DIAGNOSIS — Z95828 Presence of other vascular implants and grafts: Secondary | ICD-10-CM

## 2016-08-02 DIAGNOSIS — I872 Venous insufficiency (chronic) (peripheral): Secondary | ICD-10-CM | POA: Diagnosis not present

## 2016-08-02 DIAGNOSIS — I739 Peripheral vascular disease, unspecified: Secondary | ICD-10-CM

## 2016-08-02 NOTE — Progress Notes (Signed)
[facility] [place]   CC: Follow up peripheral artery occlusive disease  History of Present Illness Sherry Vargas is a 76 y.o. female patient of Dr. Darrick Penna who returns for follow up evaluation of PAD.  The patient has a history of a right femoral to below-knee popliteal bypass with vein at [facility] in 2012. This was done for claudication at 200 feet. She thinks she has also had angioplasties in her left leg in the past. She is on Plavix.  She denies claudication symptoms with walking, denies non healing wounds. 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 Other medical problems include atrial fibrillation which is followed by Dr. Johney Frame. She currently has been limited in her exercise secondary to this. She is on medication for rate control as well as Eliquis for anticoagulation. She also has a history of diabetes and hyperlipidemia both of which are currently stable. 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  She was hospitalized November, 2014 for CHF, pulmonary edema, and a-fib. She reports having several cardioversions and the last one converted her to NSR without relapsing into a-fib, is taking amiodarone.   She stopped going to water aerobics.   Pt Diabetic: Yes, states her last A1C was 7.? Pt smoker: former smoker, quit in the 1970's, then again in 2005  Pt meds include: Statin :Yes Betablocker: Yes ASA: No Other anticoagulants/antiplatelets: Eliquis    Past Medical History:  Diagnosis Date  . CHF (congestive heart failure) (HCC)   . Diabetes mellitus without complication (HCC)   . DM (dermatomyositis)   . HLD (hyperlipidemia)   . Hypertension   . 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 Neuropathy (HCC)   . PAD (peripheral artery disease) (HCC)    . Paroxysmal a-fib (HCC)    post operative following fem bypass at [facility] 12/11/10  . Stroke (HCC)   . Tremors of nervous system     Social History Social History  Substance Use Topics  . Smoking status: Former Smoker    Quit date: 10/11/2003  . Smokeless tobacco: Never Used     Comment: quit 2005  . Alcohol use No     Comment: no drinking since 2005    Family History Family History  Problem Relation Age of Onset  . Lung cancer Mother   . Pneumonia Mother   . Diabetes Father   . Atrial fibrillation Father   . Parkinson's disease Father   . Alcoholism Brother     Past Surgical History:  Procedure Laterality Date  . ADENOIDECTOMY    . BREAST LUMPECTOMY     benign  . CARDIOVERSION N/A 04/27/2013   Procedure: CARDIOVERSION;  Surgeon: Wendall Stade, MD;  Location: [facility] ENDOSCOPY;  Service: Cardiovascular;  Laterality: N/A;  . CARDIOVERSION N/A 05/24/2013   Procedure: CARDIOVERSION;  Surgeon: Pricilla Riffle, MD;  Location: [facility] ENDOSCOPY;  Service: Cardiovascular;  Laterality: N/A;  . CARDIOVERSION N/A 05/31/2013   Procedure: CARDIOVERSION;  Surgeon: Hillis Range, MD;  Location: [facility] OR;  Service: Cardiovascular;  Laterality: N/A;  . endovascular stents     R leg at [facility]  . FEMORAL BYPASS  12/11/10   R leg at [facility]  . INTRAOPERATIVE ARTERIOGRAM     for claudication in L leg 2004 at [facility]  .  KNEE CARTILAGE SURGERY      No Known Allergies  Current Outpatient Prescriptions  Medication Sig Dispense Refill  . ACCU-CHEK SMARTVIEW test strip 2 (two) times daily. use for testing  3  . amiodarone (PACERONE) 100 MG tablet Take 1 tablet (100 mg total) by mouth daily. 90 tablet 3  . apixaban (ELIQUIS) 5 MG TABS tablet Take 1 tablet (5 mg total) by mouth 2 (two) times daily. 180 tablet 3  . Cholecalciferol (VITAMIN D3) 1000 units CAPS Take by mouth.    . cyanocobalamin 2000 MCG tablet Take 1 tablet (2,000 mcg total) by mouth daily. 90 tablet 11  . diphenhydramine-acetaminophen (TYLENOL PM)  25-500 MG TABS tablet Take 1 tablet by mouth as needed.    . furosemide (LASIX) 40 MG tablet Take 1 tablet (40mg ) by mouth daily. May take extra 1/2 tablet (20mg ) as needed for swelling or weight gain per instruction 120 tablet 2  . Insulin Glargine (TOUJEO SOLOSTAR) 300 UNIT/ML SOPN Inject 30 Units into the skin at bedtime.    [name]. levothyroxine (SYNTHROID, LEVOTHROID) 50 MCG tablet Take 50 mcg by mouth daily before breakfast.     . metFORMIN (GLUCOPHAGE) 1000 MG tablet daily.    . metoprolol succinate (TOPROL-XL) 50 MG 24 hr tablet Take 1 tablet (50 mg total) by mouth daily. 90 tablet 3  . Multiple Vitamins-Minerals (ICAPS PO) Take 1 tablet by mouth daily.    . rosuvastatin (CRESTOR) 10 MG tablet Take 1 tablet (10 mg total) by mouth daily. 90 tablet 3   No current facility-administered medications for this visit.     ROS: See HPI for pertinent positives and negatives.   Physical Examination  Vitals:   08/02/16 1508  BP: 122/66  Pulse: 63  Resp: 20  Temp: 97.4 F (36.3 C)  TempSrc: Oral  SpO2: 99%  Weight: 207 lb 9.6 oz (94.2 kg)  Height: 5\' 3"  (1.6 m)   Body mass index is 36.77 kg/m.  General: A&O x 3, WDWN, obese female. Gait: slightly antalgic Eyes: PERRLA. Pulmonary: Respirations are non labored, CTAB, no wheezes, rales, or rhonchi. Cardiac: regular rhythm, no detected murmur.     Carotid Bruits Right Left   Negative Negative  Aorta is not palpable. Radial pulses: 2+ palpable and =   VASCULAR EXAM: Extremities without ischemic changes  without Gangrene; without open wounds.     LE Pulses Right Left   FEMORAL 2+ palpable 2+ palpable    POPLITEAL not palpable  not palpable   POSTERIOR TIBIAL not palpable, triphasic waveforms  Faintly palpable, triphasic  waveforms     DORSALIS PEDIS  ANTERIOR TIBIAL not palpable, biphasic waveforms  faintly palpable, triphasic waveforms    Abdomen: soft, NT, no palpated masses. Skin: mild venous stasis dermatitis both lower legs, dry skin, 1+ pitting edema in both lower legs, no ulcers. Musculoskeletal: no muscle wasting or atrophy. Neurologic: A&O X 3; Appropriate Affect ; SENSATION: normal; MOTOR FUNCTION: moving all extremities equally, motor strength 5/5 throughout. Speech is fluent/normal. CN 2-12 intact.     ASSESSMENT: Sherry Vargas is a 76 y.o. female who is s/p right femoral to popliteal artery bypass graft in June 2012([facility]); angioplasty left lower extremity [facility]01/2003. Currently she has no claudication symptoms with walking and no signs of ischemia in her feet/legs OA pain in her knees limits her walking.  Mild chronic venous stasis dermatitis and 1+ pitting edema in both lower legs; 20-30 mm Hg knee high graduated compression hose, see pt instructions.  Her atherosclerotic risk factors include almost in control DM, former smoker, and obesity. She takes Eliquis for controlled atrial fib. She also takes a statin.  DATA Today's right LE arterial duplex suggests a widely patent graft with <30% stenosis in the native ostial SFA. No significant change compared to the last exam on 05-11-16.                  ABI's today: Right: 1.03 (1.23, 05-11-16), Waveforms: PT: triphasic, DP: biphasic; TBI: 0.59 (0.67, 05-11-16) Left: 0.92 (1.31, 05-11-16), triphasic waveforms; TBI: 0.63 (0.82, 05-11-16) Right ABI remains normal. Left ABI declined from normal to mild arterial occlusive disease.    PLAN:  Based on the patient's vascular studies and examination, pt will return to clinic in 1 year for ABI's and right LE arterial duplex.  I advised her and her son to notify us if she develops concerns re the circulation in her feet/legs.   I discussed in  depth with the patient the nature of atherosclerosis, and emphasized the importance of maximal medical management including strict control of blood pressure, blood glucose, and lipid levels, obtaining regular exercise, and continued cessation of smoking.  The patient is aware that without maximal medical management the underlying atherosclerotic disease process will progress, limiting the benefit of any interventions.  The patient was given information about PAD including signs, symptoms, treatment, what symptoms should prompt the patient to seek immediate medical care, and risk reduction measures to take.  Charisse March, RN, MSN, FNP-C [facility] [place] Phone: [phone]  Clinic MD: Darrick Penna  08/02/16 3:29 PM

## 2016-08-02 NOTE — Patient Instructions (Addendum)
Peripheral Vascular Disease Peripheral vascular disease (PVD) is a disease of the blood vessels that are not part of your heart and brain. A simple term for PVD is poor circulation. In most cases, PVD narrows the blood vessels that carry blood from your heart to the rest of your body. This can result in a decreased supply of blood to your arms, legs, and internal organs, like your stomach or kidneys. However, it most often affects a person's lower legs and feet. There are two types of PVD.  Organic PVD. This is the more common type. It is caused by damage to the structure of blood vessels.  Functional PVD. This is caused by conditions that make blood vessels contract and tighten (spasm). Without treatment, PVD tends to get worse over time. PVD can also lead to acute ischemic limb. This is when an arm or limb suddenly has trouble getting enough blood. This is a medical emergency. Follow these instructions at home:  Take medicines only as told by your doctor.  Do not use any tobacco products, including cigarettes, chewing tobacco, or electronic cigarettes. If you need help quitting, ask your doctor.  Lose weight if you are overweight, and maintain a healthy weight as told by your doctor.  Eat a diet that is low in fat and cholesterol. If you need help, ask your doctor.  Exercise regularly. Ask your doctor for some good activities for you.  Take good care of your feet.  Wear comfortable shoes that fit well.  Check your feet often for any cuts or sores. Contact a doctor if:  You have cramps in your legs while walking.  You have leg pain when you are at rest.  You have coldness in a leg or foot.  Your skin changes.  You are unable to get or have an erection (erectile dysfunction).  You have cuts or sores on your feet that are not healing. Get help right away if:  Your arm or leg turns cold and blue.  Your arms or legs become red, warm, swollen, painful, or numb.  You have  chest pain or trouble breathing.  You suddenly have weakness in your face, arm, or leg.  You become very confused or you cannot speak.  You suddenly have a very bad headache.  You suddenly cannot see. This information is not intended to replace advice given to you by your health care provider. Make sure you discuss any questions you have with your health care provider. Document Released: 09/11/2009 Document Revised: 11/23/2015 Document Reviewed: 11/25/2013 Elsevier Interactive Patient Education  2017 Elsevier Inc.     Venous Stasis or Chronic Venous Insufficiency Chronic venous insufficiency, also called venous stasis, is a condition that affects the veins in the legs. The condition prevents blood from being pumped through these veins effectively. Blood may no longer be pumped effectively from the legs back to the heart. This condition can range from mild to severe. With proper treatment, you should be able to continue with an active life. CAUSES  Chronic venous insufficiency occurs when the vein walls become stretched, weakened, or damaged or when valves within the vein are damaged. Some common causes of this include:  High blood pressure inside the veins (venous hypertension).  Increased blood pressure in the leg veins from long periods of sitting or standing.  A blood clot that blocks blood flow in a vein (deep vein thrombosis).  Inflammation of a superficial vein (phlebitis) that causes a blood clot to form. RISK FACTORS Various things   can make you more likely to develop chronic venous insufficiency, including:  Family history of this condition.  Obesity.  Pregnancy.  Sedentary lifestyle.  Smoking.  Jobs requiring long periods of standing or sitting in one place.  Being a certain age. Women in their 40s and 50s and men in their 70s are more likely to develop this condition. SIGNS AND SYMPTOMS  Symptoms may include:   Varicose veins.  Skin breakdown or  ulcers.  Reddened or discolored skin on the leg.  Brown, smooth, tight, and painful skin just above the ankle, usually on the inside surface (lipodermatosclerosis).  Swelling. DIAGNOSIS  To diagnose this condition, your health care provider will take a medical history and do a physical exam. The following tests may be ordered to confirm the diagnosis:  Duplex ultrasound-A procedure that produces a picture of a blood vessel and nearby organs and also provides information on blood flow through the blood vessel.  Plethysmography-A procedure that tests blood flow.  A venogram, or venography-A procedure used to look at the veins using X-ray and dye. TREATMENT The goals of treatment are to help you return to an active life and to minimize pain or disability. Treatment will depend on the severity of the condition. Medical procedures may be needed for severe cases. Treatment options may include:   Use of compression stockings. These can help with symptoms and lower the chances of the problem getting worse, but they do not cure the problem.  Sclerotherapy-A procedure involving an injection of a material that "dissolves" the damaged veins. Other veins in the network of blood vessels take over the function of the damaged veins.  Surgery to remove the vein or cut off blood flow through the vein (vein stripping or laser ablation surgery).  Surgery to repair a valve. HOME CARE INSTRUCTIONS   Wear compression stockings as directed by your health care provider.  Only take over-the-counter or prescription medicines for pain, discomfort, or fever as directed by your health care provider.  Follow up with your health care provider as directed. SEEK MEDICAL CARE IF:   You have redness, swelling, or increasing pain in the affected area.  You see a red streak or line that extends up or down from the affected area.  You have a breakdown or loss of skin in the affected area, even if the breakdown is  small.  You have an injury to the affected area. SEEK IMMEDIATE MEDICAL CARE IF:   You have an injury and open wound in the affected area.  Your pain is severe and does not improve with medicine.  You have sudden numbness or weakness in the foot or ankle below the affected area, or you have trouble moving your foot or ankle.  You have a fever or persistent symptoms for more than 2-3 days.  You have a fever and your symptoms suddenly get worse. MAKE SURE YOU:   Understand these instructions.  Will watch your condition.  Will get help right away if you are not doing well or get worse. This information is not intended to replace advice given to you by your health care provider. Make sure you discuss any questions you have with your health care provider. Document Released: 10/21/2006 Document Revised: 04/07/2013 Document Reviewed: 02/22/2013 Elsevier Interactive Patient Education  2017 Elsevier Inc.     To measure for knee high compression hose: Measure the length of calf, largest circumference of calf, and ankle circumference first thing in the morning before your legs have a   chance to swell.  Take these 3 measurements with you to obtain 20-30 mm mercury graduated knee high compression hose.  Put the stockings on in the morning, remove at bedtime.   

## 2016-08-07 NOTE — Addendum Note (Signed)
Addended by: Burton ApleyPETTY, Graceyn Fodor A on: 08/07/2016 10:40 AM   Modules accepted: Orders

## 2016-08-16 ENCOUNTER — Encounter: Payer: Medicare Other | Admitting: Neurology

## 2016-08-23 ENCOUNTER — Encounter (INDEPENDENT_AMBULATORY_CARE_PROVIDER_SITE_OTHER): Payer: Self-pay

## 2016-08-23 ENCOUNTER — Ambulatory Visit (INDEPENDENT_AMBULATORY_CARE_PROVIDER_SITE_OTHER): Payer: Medicare Other | Admitting: Neurology

## 2016-08-23 DIAGNOSIS — E119 Type 2 diabetes mellitus without complications: Secondary | ICD-10-CM

## 2016-08-23 DIAGNOSIS — Z0289 Encounter for other administrative examinations: Secondary | ICD-10-CM

## 2016-08-23 DIAGNOSIS — I4819 Other persistent atrial fibrillation: Secondary | ICD-10-CM

## 2016-08-23 DIAGNOSIS — R202 Paresthesia of skin: Secondary | ICD-10-CM

## 2016-08-23 DIAGNOSIS — R269 Unspecified abnormalities of gait and mobility: Secondary | ICD-10-CM

## 2016-08-23 NOTE — Procedures (Signed)
Full Name: Arthella Headings Gender: Female MRN #: [id number] Date of Birth: 09/22/1940    Visit Date: 08/23/2016 12:17 Age: 76 Years 3 Months Old Examining Physician: Levert Feinstein, MD  Referring Physician: Terrace Arabia History: 76 years old right-handed female, with history of poorly controlled diabetes, multiple vascular procedures right lower extremity, presented with worsening bilateral feet paresthesia right leg worse than left. She denies significant low back pain.  Summary of test:  Nerve conduction study: Absent bilateral sural, superficial peroneal sensory responses. Left peroneal and tibial motor responses were normal. Right peroneal motor responses showed significantly decreased to C map amplitude, mild slow conduction velocity. Right tibial motor response showed decreased C map motor response at proximal stimulation side, preserved at distal stimulation side, due to right calf area surgical scar. Electromyography: Selective needle examination of bilateral lower extremity muscles and bilateral lumbar sacral paraspinal muscles showed no significant abnormality.  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 There is no evidence of bilateral lumbar sacral radiculopathy.   ------------------------------- Levert Feinstein, M.D.  [facility] [address] [place], [place] [address] Tel: [phone] Fax: [phone]        [facility]    Nerve / Sites Rec. Site Peak Lat Ref. Amp.1-2 Ref. Distance    ms ms V V cm  R Sural - Ankle (Calf)     Calf Ankle NR ?4.40 NR ?6.0 14  L Sural - Ankle (Calf)     Calf Ankle NR ?4.40 NR ?6.0 14  R Superficial peroneal - Ankle     Lat leg Ankle NR ?4.40 NR ?6.0 14  L Superficial peroneal - Ankle     Lat leg Ankle NR ?4.40 NR ?6.0 14     [facility]    Nerve / Sites Muscle Latency Ref. Amplitude Ref. Rel Amp Segments Distance Lat Diff Velocity Ref. Area    ms ms mV mV %  cm  ms m/s m/s mVms  L Peroneal - EDB     Ankle EDB 4.7 ?6.5 2.4 ?2.0 100 Ankle - EDB 9    5.1     Fib head EDB 11.4  2.2  92.1 Fib head - Ankle 28 6.7 42 ?44 5.5     Pop fossa EDB 13.3  2.1  96.6 Pop fossa - Fib head 8 1.9 42 ?44 5.7         Pop fossa - Ankle  8.6     R Peroneal - EDB     Ankle EDB 5.9 ?6.5 0.6 ?2.0 100 Ankle - EDB 9    1.7     Fib head EDB 13.4  0.5  84 Fib head - Ankle 28 7.6 37 ?44 1.6     Pop fossa EDB 15.5  0.5  102 Pop fossa - Fib head 8 2.1 38 ?44 1.5         Pop fossa - Ankle  9.6     L Tibial - AH     Ankle AH 4.4 ?5.8 7.9 ?4.0 100 Ankle - AH 9    18.3     Pop fossa AH 12.3  5.1  64.4 Pop fossa - Ankle 32 8.0 40 ?41 15.2  R Tibial - AH     Ankle AH 4.9 ?5.8 4.2 ?4.0 100 Ankle - AH 9    10.6     Pop fossa AH 14.7  0.6  14.6 Pop fossa - Ankle 32 9.7 33 ?  41 2.6     F  Wave    Nerve F Lat Ref.   ms ms  L Tibial - AH 55.3 ?56.0     EMG       EMG Summary Table    Spontaneous MUAP Recruitment  Muscle IA Fib PSW Fasc Other Amp Dur. Poly Pattern  R. Tibialis anterior Normal None None None _______ Normal Normal Normal Normal  R. Gastrocnemius (Medial head) Normal None None None _______ Normal Normal Normal Normal  L. Vastus lateralis Normal None None None _______ Normal Normal Normal Normal  R. Vastus lateralis Normal None None None _______ Normal Normal Normal Normal  L. Tibialis anterior Normal None None None _______ Normal Normal Normal Normal  L. Gastrocnemius (Medial head) Normal None None None _______ Normal Normal Normal Normal  L. Lumbar paraspinals (low) Normal None None None _______ Normal Normal Normal Normal  L. Lumbar paraspinals (mid) Normal None None None _______ Normal Normal Normal Normal  R. Lumbar paraspinals (low) Normal None None None _______ Normal Normal Normal Normal  R. Lumbar paraspinals (mid) Normal None None None _______ Normal Normal Normal Normal

## 2016-08-23 NOTE — Progress Notes (Signed)
PATIENT: Sherry Vargas DOB: 02/15/1941  No chief complaint on file.    HISTORICAL  Sherry Vargas is a 76 years old right-handed female, seen in refer by  her primary care doctor Lin Landsman for evaluation of bilateral feet paresthesia, hand tremor, initial evaluation was on June 05 2016.  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  Tremor: She denies a family history of tremor, since 2015, she noticed intermittent posturing tremor, especially when holding the subject with her left hand, she denied loss sense of smell, no Schramm sleep disorder  Falling episode: She is very sedentary, really exercise, over the past 7 years, she has to falling episode, the most recent was in summer 2017, when she was bending down trying to break a tree branch, she fell backwards, needing assistant to get up from the ground  Feet numbness: She had a history of diabetes for over 20 years, around 2002, she began to have numbness at her foot, involving right on the left, bottom of her feet, extending to distal leg,  Laboratory evaluation reviewed, A1c on April 2017 was 10, reported significant improvement by November 2017 after starting insulin, it is now dropped to 6.7.  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  We reviewed laboratory evaluation in December 2017, low vitamin B12 less than 150, elevated at methylmalonic acid level 427, normal homocystine, folic acid, decreased vitamin D 321.8, normal ESR, copper, ferritin, CPK, negative RPR  REVIEW OF SYSTEMS: Full 14 system review of systems performed and notable only for  ringing ears, depression anxiety headache, swelling legs, memory loss, confusion, weakness, numbness, insomnia, diarrhea constipation, joint pain itching  ALLERGIES: No Known Allergies  HOME MEDICATIONS: Current Outpatient Prescriptions  Medication Sig Dispense Refill  . ACCU-CHEK SMARTVIEW test strip 2 (two) times daily. use for testing  3  . amiodarone (PACERONE) 100 MG tablet Take 1 tablet (100 mg total) by mouth daily. 90 tablet 3  . apixaban (ELIQUIS) 5 MG TABS tablet Take 1 tablet (5 mg total) by mouth 2 (two) times daily. 180 tablet 3  . Cholecalciferol (VITAMIN D3) 1000 units CAPS Take by mouth.    . cyanocobalamin 2000 MCG tablet Take 1 tablet (2,000 mcg total) by mouth daily. 90 tablet 11  . diphenhydramine-acetaminophen (TYLENOL PM) 25-500 MG TABS tablet Take 1 tablet by mouth as needed.    . furosemide (LASIX) 40 MG tablet Take 1 tablet (57m) by mouth daily. May take extra 1/2 tablet (266m as needed for swelling or weight gain per instruction 120 tablet 2  . Insulin Glargine (TOUJEO SOLOSTAR) 300 UNIT/ML SOPN Inject 30 Units into the skin at bedtime.    . Marland Kitchenevothyroxine (SYNTHROID, LEVOTHROID) 50 MCG tablet Take 50 mcg by mouth daily before breakfast.     . metFORMIN (GLUCOPHAGE) 1000 MG tablet daily.    . metoprolol succinate (TOPROL-XL) 50 MG 24 hr tablet Take 1 tablet (50 mg total) by mouth daily. 90 tablet 3  . Multiple Vitamins-Minerals (ICAPS PO) Take 1 tablet by mouth daily.    . rosuvastatin (CRESTOR) 10 MG tablet Take 1 tablet (10 mg total) by mouth daily. 90 tablet 3   No current facility-administered medications  for this visit.     PAST MEDICAL HISTORY: Past Medical History:  Diagnosis Date  . CHF (congestive heart failure) ([place])   . Diabetes mellitus without complication ([place])   . DM (dermatomyositis)   . HLD (hyperlipidemia)   . Hypertension   . 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 Neuropathy ([place])   . PAD (peripheral artery disease)  ([place])   . Paroxysmal a-fib ([place])    post operative following fem bypass at [facility] 12/11/10  . Stroke ([place])   . Tremors of nervous system     PAST SURGICAL HISTORY: Past Surgical History:  Procedure Laterality Date  . ADENOIDECTOMY    . BREAST LUMPECTOMY     benign  . CARDIOVERSION N/A 04/27/2013   Procedure: CARDIOVERSION;  Surgeon: Josue Hector, MD;  [facility] ENDOSCOPY;  Service: Cardiovascular;  Laterality: N/A;  . CARDIOVERSION N/A 05/24/2013   Procedure: CARDIOVERSION;  Surgeon: Fay Records, MD;  Location: [place];  Service: Cardiovascular;  Laterality: N/A;  . CARDIOVERSION N/A 05/31/2013   Procedure: CARDIOVERSION;  Surgeon: Thompson Grayer, MD;  Location: [place];  Service: Cardiovascular;  Laterality: N/A;  . endovascular stents     R leg at [facility]  . FEMORAL BYPASS  12/11/10   R leg at [facility]  . INTRAOPERATIVE ARTERIOGRAM     for claudication in L leg 2004 at [facility]  . KNEE CARTILAGE SURGERY      FAMILY HISTORY: Family History  Problem Relation Age of Onset  . Lung cancer Mother   . Pneumonia Mother   . Diabetes Father   . Atrial fibrillation Father   . Parkinson's disease Father   . Alcoholism Brother     SOCIAL HISTORY:  Social History   Social History  . Marital status: Single    Spouse name: N/A  . Number of children: 1  . Years of education: Masters   Occupational History  . Retired    Social History Main Topics  . Smoking status: Former Smoker    Quit date: 10/11/2003  . Smokeless tobacco: Never Used     Comment: quit 2005  . Alcohol use No     Comment: no drinking since 2005  . Drug use: No  . Sexual activity: Not on file   Other Topics Concern  . Not on file   Social History Narrative   Lives in [place], near [place].   Retired Market researcher.   Lives at home with son.   No caffeine use.   Right-handed.        PHYSICAL EXAM   There were no vitals filed for this visit.  Not recorded      There is no height or  weight on file to calculate BMI.  PHYSICAL EXAMNIATION:  Gen: NAD, conversant, well nourised, obese, well groomed                     Cardiovascular: Regular rate rhythm, no peripheral edema, warm, nontender. Eyes: Conjunctivae clear without exudates or hemorrhage Neck: Supple, no carotid bruits. Pulmonary: Clear to auscultation bilaterally   NEUROLOGICAL EXAM:  MENTAL STATUS: Speech:    Speech is normal; fluent and spontaneous with normal comprehension.  Cognition:     Orientation to time, place and person     Normal recent and remote memory     Normal Attention span and concentration     Normal Language, naming, repeating,spontaneous speech     Fund of knowledge  CRANIAL NERVES: CN II: Visual fields are full to confrontation. Fundoscopic exam is normal with sharp discs and no vascular changes. Pupils are round equal and briskly reactive to light. CN III, IV, VI: extraocular movement are normal. No ptosis. CN V: Facial sensation is intact to pinprick in all 3 divisions bilaterally. Corneal responses are intact.  CN VII: Face is symmetric with normal eye closure and smile. CN VIII: Hearing is normal to rubbing fingers CN IX, X: Palate elevates symmetrically. Phonation is normal. CN XI: Head turning and shoulder shrug are intact CN XII: Tongue is midline with normal movements and no atrophy.  MOTOR: There is no pronator drift of out-stretched arms. Muscle bulk and tone are normal. Muscle strength is normal.  REFLEXES: Reflexes are 2+ and symmetric at the biceps, triceps, knees, and absent at ankles. Plantar responses are flexor.  SENSORY: Length dependence decreased to  light touch, pinprick to distal shin level, absent  vibratory sensation at toes   COORDINATION: Rapid alternating movements and fine finger movements are intact. There is no dysmetria on finger-to-nose and heel-knee-shin.    GAIT/STANCE: Posture is normal. Gait is steady with normal steps, base, arm swing,  and turning. Heel and toe walking are normal. Tandem gait is normal.  Romberg is absent.   DIAGNOSTIC DATA (LABS, IMAGING, TESTING) - I reviewed patient records, labs, notes, testing and imaging myself where available.   ASSESSMENT AND PLAN  CATHALINA BARCIA is a 76 y.o. 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  Marcial Pacas, M.D. Ph.D.  [facility] [address] [place], [place] [address] Ph: [phone] Fax: [phone]  CC: Lin Landsman, MD

## 2016-10-17 ENCOUNTER — Ambulatory Visit (HOSPITAL_COMMUNITY)
Admission: RE | Admit: 2016-10-17 | Discharge: 2016-10-17 | Disposition: A | Discharge status: Home / Self Care | Payer: Medicare Other | Source: Ambulatory Visit | Attending: Nurse Practitioner | Admitting: Nurse Practitioner

## 2016-10-17 ENCOUNTER — Encounter (HOSPITAL_COMMUNITY): Payer: Self-pay | Admitting: Nurse Practitioner

## 2016-10-17 VITALS — BP 140/68 | HR 71 | Ht 63.0 in | Wt 216.4 lb

## 2016-10-17 DIAGNOSIS — E119 Type 2 diabetes mellitus without complications: Secondary | ICD-10-CM | POA: Insufficient documentation

## 2016-10-17 DIAGNOSIS — Z7901 Long term (current) use of anticoagulants: Secondary | ICD-10-CM | POA: Insufficient documentation

## 2016-10-17 DIAGNOSIS — E785 Hyperlipidemia, unspecified: Secondary | ICD-10-CM | POA: Diagnosis not present

## 2016-10-17 DIAGNOSIS — I481 Persistent atrial fibrillation: Secondary | ICD-10-CM | POA: Diagnosis not present

## 2016-10-17 DIAGNOSIS — Z794 Long term (current) use of insulin: Secondary | ICD-10-CM | POA: Insufficient documentation

## 2016-10-17 DIAGNOSIS — I480 Paroxysmal atrial fibrillation: Secondary | ICD-10-CM | POA: Diagnosis not present

## 2016-10-17 DIAGNOSIS — Z8673 Personal history of transient ischemic attack (TIA), and cerebral infarction without residual deficits: Secondary | ICD-10-CM | POA: Diagnosis not present

## 2016-10-17 DIAGNOSIS — I509 Heart failure, unspecified: Secondary | ICD-10-CM | POA: Diagnosis not present

## 2016-10-17 DIAGNOSIS — I4891 Unspecified atrial fibrillation: Secondary | ICD-10-CM | POA: Diagnosis present

## 2016-10-17 DIAGNOSIS — M3313 Other dermatomyositis without myopathy: Secondary | ICD-10-CM | POA: Diagnosis not present

## 2016-10-17 DIAGNOSIS — I4819 Other persistent atrial fibrillation: Secondary | ICD-10-CM

## 2016-10-17 DIAGNOSIS — Z87891 Personal history of nicotine dependence: Secondary | ICD-10-CM | POA: Diagnosis not present

## 2016-10-17 DIAGNOSIS — I110 Hypertensive heart disease with heart failure: Secondary | ICD-10-CM | POA: Insufficient documentation

## 2016-10-17 LAB — COMPREHENSIVE METABOLIC PANEL
ALBUMIN: 3.5 g/dL (ref 3.5–5.0)
ALK PHOS: 105 U/L (ref 38–126)
ALT: 15 U/L (ref 14–54)
AST: 22 U/L (ref 15–41)
Anion gap: 9 (ref 5–15)
BUN: 16 mg/dL (ref 6–20)
CALCIUM: 9.2 mg/dL (ref 8.9–10.3)
CO2: 27 mmol/L (ref 22–32)
Chloride: 104 mmol/L (ref 101–111)
Creatinine, Ser: 0.98 mg/dL (ref 0.44–1.00)
GFR calc Af Amer: >60 mL/min (ref >60)
GFR calc non Af Amer: 55 mL/min — ABNORMAL LOW (ref >60)
GLUCOSE: 148 mg/dL — AB (ref 65–99)
Potassium: 4.1 mmol/L (ref 3.5–5.1)
SODIUM: 140 mmol/L (ref 135–145)
Total Bilirubin: 0.5 mg/dL (ref 0.3–1.2)
Total Protein: 6.6 g/dL (ref 6.5–8.1)

## 2016-10-17 LAB — TSH: TSH: 0.346 u[IU]/mL — ABNORMAL LOW (ref 0.350–4.500)

## 2016-10-17 NOTE — Patient Instructions (Signed)
No caffeine, inhalers or smoking 4 hours prior to testing Go to main entrance of hospital and report to respiratory  Arrive 15 minutes prior to appointment

## 2016-10-17 NOTE — Progress Notes (Signed)
Patient ID: Sherry Vargas, female   DOB: [birth year], 76 y.o.   MRN: [id number]     Primary Care Physician: Karie Chimera, MD Referring Physician:Dr.Allred   Sherry Vargas is a 76 y.o. female with a h/o persisitent atrial fibrillation maintaining SR on amiodarone. The dose was decreased to 100 mg daily and this appears to be keeping her in rhythm.TSH was elevated and PCP started her on thyroid replacement.. She feels well and is not noticing any afib.  No bleeding issues with DOAC and taking regularly.   F/u in afib clinic 4/19, she is feeling well. No issues with afib. Continues on amiodarone. No bleeding issues with eliquis.  Today, she denies symptoms of palpitations, chest pain, shortness of breath, orthopnea, PND, lower extremity edema, dizziness, presyncope, syncope, or neurologic sequela. The patient is tolerating medications without difficulties and is otherwise without complaint today.   Past Medical History:  Diagnosis Date  . CHF (congestive heart failure) ([facility])   . Diabetes mellitus without complication ([facility])   . DM (dermatomyositis)   . HLD (hyperlipidemia)   . Hypertension   . 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 Neuropathy   . PAD (peripheral artery disease) ([facility])   . Paroxysmal A-fib ([facility])    post operative following fem bypass at [facility] 12/11/10  . Stroke ([facility])   . Tremors of nervous system    Past Surgical History:  Procedure Laterality Date  . ADENOIDECTOMY    . BREAST LUMPECTOMY     benign  . CARDIOVERSION N/A 04/27/2013   Procedure: CARDIOVERSION;  Surgeon: Wendall Stade, MD;  Location: [facility] ENDOSCOPY;  Service: Cardiovascular;  Laterality: N/A;  . CARDIOVERSION N/A 05/24/2013   Procedure: CARDIOVERSION;  Surgeon: Pricilla Riffle, MD;  Location: [facility] ENDOSCOPY;  Service: Cardiovascular;  Laterality: N/A;  . CARDIOVERSION N/A 05/31/2013   Procedure: CARDIOVERSION;  Surgeon: Hillis Range, MD;  Location: [facility] OR;  Service: Cardiovascular;  Laterality:  N/A;  . endovascular stents     R leg at [facility]  . FEMORAL BYPASS  12/11/10   R leg at [facility]  . INTRAOPERATIVE ARTERIOGRAM     for claudication in L leg 2004 at [facility]  . KNEE CARTILAGE SURGERY      Current Outpatient Prescriptions  Medication Sig Dispense Refill  . ACCU-CHEK SMARTVIEW test strip 2 (two) times daily. use for testing  3  . amiodarone (PACERONE) 100 MG tablet Take 1 tablet (100 mg total) by mouth daily. 90 tablet 3  . amoxicillin-clavulanate (AUGMENTIN) 875-125 MG tablet 1 tablet 2 (two) times daily.    [name] apixaban (ELIQUIS) 5 MG TABS tablet Take 1 tablet (5 mg total) by mouth 2 (two) times daily. 180 tablet 3  . Cholecalciferol (VITAMIN D3) 1000 units CAPS Take by mouth.    . cyanocobalamin 2000 MCG tablet Take 1 tablet (2,000 mcg total) by mouth daily. 90 tablet 11  . diphenhydramine-acetaminophen (TYLENOL PM) 25-500 MG TABS tablet Take 1 tablet by mouth as needed.    . furosemide (LASIX) 40 MG tablet Take 1 tablet ( ) by mouth daily. May take extra 1/2 tablet ( ) as needed for swelling or weight gain per instruction 120 tablet 2  . Insulin Glargine (TOUJEO SOLOSTAR) 300 UNIT/ML SOPN Inject 30 Units into the skin at bedtime.    [name] levothyroxine (SYNTHROID, LEVOTHROID) 50 MCG tablet Take 50 mcg by mouth daily before breakfast.     . metFORMIN (GLUCOPHAGE) 1000 MG tablet daily.    . metoprolol succinate (  TOPROL-XL) 50 MG 24 hr tablet Take 1 tablet (50 mg total) by mouth daily. 90 tablet 3  . Multiple Vitamins-Minerals (ICAPS PO) Take 1 tablet by mouth daily.    . rosuvastatin (CRESTOR) 10 MG tablet Take 1 tablet (10 mg total) by mouth daily. 90 tablet 3   No current facility-administered medications for this encounter.     No Known Allergies  Social History   Social History  . Marital status: Single    Spouse name: N/A  . Number of children: 1  . Years of education: Masters   Occupational History  . Retired    Social History Main Topics  . Smoking status:  Former Smoker    Quit date: 10/11/2003  . Smokeless tobacco: Never Used     Comment: quit 2005  . Alcohol use No     Comment: no drinking since 2005  . Drug use: No  . Sexual activity: Not on file   Other Topics Concern  . Not on file   Social History Narrative   Lives in [place], near [place].   Retired Product/process development scientist.   Lives at home with son.   No caffeine use.   Right-handed.       Family History  Problem Relation Age of Onset  . Lung cancer Mother   . Pneumonia Mother   . Diabetes Father   . Atrial fibrillation Father   . Parkinson's disease Father   . Alcoholism Brother     ROS- All systems are reviewed and negative except as per the HPI above  Physical Exam: Vitals:   10/17/16 1446  BP: 140/68  Pulse: 71  Weight: 216 lb 6.4 oz (98.2 kg)  Height:  (1.6 m)    GEN- The patient is well appearing, alert and oriented x 3 today.   Head- normocephalic, atraumatic Eyes-  Sclera clear, conjunctiva pink Ears- hearing intact Oropharynx- clear Neck- supple, no JVP Lymph- no cervical lymphadenopathy Lungs- Clear to ausculation bilaterally, normal work of breathing Heart- regular rate and rhythm, no murmurs, rubs or gallops, PMI not laterally displaced GI- soft, NT, ND, + BS Extremities- no clubbing, cyanosis, or edema MS- no significant deformity or atrophy Skin- no rash or lesion Psych- euthymic mood, full affect Neuro- strength and sensation are intact  EKG-SR at 71 bpm, PR 144 ms, QRS 74 ms,Qtc 446 ms. Epic records reviewed  Assessment and Plan:  1. Afib Maintaining SR on 100 mg amiodarone Continue Eliquis Cmet, TSH today PFT's scheduled  2. HTN Stable  3. Prior CVA Continue DOAC  F/u in afib clinic in 6 months  Lupita Leash C. [facility] [address] [place], [place] [address] [phone]

## 2016-10-29 ENCOUNTER — Ambulatory Visit (HOSPITAL_COMMUNITY)
Admission: RE | Admit: 2016-10-29 | Discharge: 2016-10-29 | Disposition: A | Discharge status: Home / Self Care | Payer: Medicare Other | Source: Ambulatory Visit | Attending: Nurse Practitioner | Admitting: Nurse Practitioner

## 2016-10-29 DIAGNOSIS — I481 Persistent atrial fibrillation: Secondary | ICD-10-CM | POA: Insufficient documentation

## 2016-10-29 DIAGNOSIS — I4819 Other persistent atrial fibrillation: Secondary | ICD-10-CM

## 2016-10-29 LAB — PULMONARY FUNCTION TEST
DL/VA % pred: 62 %
DL/VA: 2.88 ml/min/mmHg/L
DLCO unc % pred: 53 %
DLCO unc: 11.83 ml/min/mmHg
FEF 25-75 Post: 1.78 L/sec
FEF 25-75 Pre: 1.33 L/sec
FEF2575-%Change-Post: 34 %
FEF2575-%Pred-Post: 111 %
FEF2575-%Pred-Pre: 82 %
FEV1-%Change-Post: 5 %
FEV1-%PRED-POST: 101 %
FEV1-%PRED-PRE: 95 %
FEV1-POST: 2.02 L
FEV1-PRE: 1.9 L
FEV1FVC-%CHANGE-POST: 9 %
FEV1FVC-%Pred-Pre: 98 %
FEV6-%Change-Post: -1 %
FEV6-%PRED-PRE: 101 %
FEV6-%Pred-Post: 99 %
FEV6-POST: 2.5 L
FEV6-PRE: 2.55 L
FEV6FVC-%Change-Post: 1 %
FEV6FVC-%PRED-POST: 105 %
FEV6FVC-%PRED-PRE: 103 %
FVC-%CHANGE-POST: -3 %
FVC-%PRED-PRE: 97 %
FVC-%Pred-Post: 94 %
FVC-POST: 2.5 L
FVC-Pre: 2.58 L
POST FEV6/FVC RATIO: 100 %
PRE FEV6/FVC RATIO: 98 %
Post FEV1/FVC ratio: 81 %
Pre FEV1/FVC ratio: 74 %
RV % PRED: 91 %
RV: 2.01 L
TLC % pred: 98 %
TLC: 4.78 L

## 2016-10-29 MED ORDER — ALBUTEROL SULFATE (2.5 MG/3ML) 0.083% IN NEBU
2.5000 mg | INHALATION_SOLUTION | Freq: Once | RESPIRATORY_TRACT | Status: AC
  Administered 2016-10-29: 2.5 mg via RESPIRATORY_TRACT

## 2016-11-17 ENCOUNTER — Other Ambulatory Visit (HOSPITAL_COMMUNITY): Payer: Self-pay | Admitting: Nurse Practitioner

## 2017-05-16 MED ORDER — DEXTROSE 50 % IV SOLN
12.50 | INTRAVENOUS | Status: DC
Start: ? — End: 2017-05-16

## 2017-05-16 MED ORDER — POLYETHYLENE GLYCOL 3350 17 G PO PACK
17.00 | PACK | ORAL | Status: DC
Start: ? — End: 2017-05-16

## 2017-05-16 MED ORDER — APIXABAN 5 MG PO TABS
5.00 | ORAL_TABLET | ORAL | Status: DC
Start: 2017-05-16 — End: 2017-05-16

## 2017-05-16 MED ORDER — ACETAMINOPHEN 325 MG PO TABS
975.00 | ORAL_TABLET | ORAL | Status: DC
Start: ? — End: 2017-05-16

## 2017-05-16 MED ORDER — BENZONATATE 100 MG PO CAPS
200.00 | ORAL_CAPSULE | ORAL | Status: DC
Start: ? — End: 2017-05-16

## 2017-05-16 MED ORDER — LIDOCAINE 5 % EX PTCH
1.00 | MEDICATED_PATCH | CUTANEOUS | Status: DC
Start: 2017-05-16 — End: 2017-05-16

## 2017-05-16 MED ORDER — LIDOCAINE HCL 1 % IJ SOLN
3.00 | INTRAMUSCULAR | Status: DC
Start: ? — End: 2017-05-16

## 2017-05-16 MED ORDER — ONDANSETRON HCL 4 MG/2ML IJ SOLN
4.00 | INTRAMUSCULAR | Status: DC
Start: ? — End: 2017-05-16

## 2017-05-16 MED ORDER — INSULIN GLARGINE 100 UNIT/ML ~~LOC~~ SOLN
30.00 | SUBCUTANEOUS | Status: DC
Start: 2017-05-17 — End: 2017-05-16

## 2017-05-16 MED ORDER — INSULIN LISPRO 100 UNIT/ML ~~LOC~~ SOLN
7.00 | SUBCUTANEOUS | Status: DC
Start: 2017-05-16 — End: 2017-05-16

## 2017-05-16 MED ORDER — LEVOTHYROXINE SODIUM 50 MCG PO TABS
50.00 | ORAL_TABLET | ORAL | Status: DC
Start: 2017-05-17 — End: 2017-05-16

## 2017-05-16 MED ORDER — CEFAZOLIN SODIUM-DEXTROSE 2-3 GM-%(50ML) IV SOLR
2.00 | INTRAVENOUS | Status: DC
Start: 2017-05-16 — End: 2017-05-16

## 2017-05-16 MED ORDER — FUROSEMIDE 40 MG PO TABS
40.00 | ORAL_TABLET | ORAL | Status: DC
Start: 2017-05-17 — End: 2017-05-16

## 2017-05-16 MED ORDER — LIDOCAINE HCL (PF) 1 % IJ SOLN
.50 | INTRAMUSCULAR | Status: DC
Start: ? — End: 2017-05-16

## 2017-05-16 MED ORDER — METOPROLOL SUCCINATE ER 50 MG PO TB24
100.00 | ORAL_TABLET | ORAL | Status: DC
Start: 2017-05-17 — End: 2017-05-16

## 2017-05-16 MED ORDER — INSULIN LISPRO 100 UNIT/ML ~~LOC~~ SOLN
.00 | SUBCUTANEOUS | Status: DC
Start: 2017-05-16 — End: 2017-05-16

## 2017-05-16 MED ORDER — AMIODARONE HCL 200 MG PO TABS
200.00 | ORAL_TABLET | ORAL | Status: DC
Start: 2017-05-17 — End: 2017-05-16

## 2017-05-16 MED ORDER — GLUCAGON HCL RDNA (DIAGNOSTIC) 1 MG IJ SOLR
1.00 | INTRAMUSCULAR | Status: DC
Start: ? — End: 2017-05-16

## 2017-06-02 ENCOUNTER — Ambulatory Visit: Payer: Medicare Other | Admitting: Internal Medicine

## 2017-08-21 ENCOUNTER — Ambulatory Visit: Payer: Medicare Other | Admitting: Family

## 2017-08-21 ENCOUNTER — Other Ambulatory Visit (HOSPITAL_COMMUNITY): Payer: Medicare Other

## 2017-08-21 ENCOUNTER — Encounter (HOSPITAL_COMMUNITY): Payer: Medicare Other
